# Patient Record
Sex: Male | Born: 1990 | Race: Black or African American | Hispanic: No | Marital: Married | Smoking: Current every day smoker
Health system: Southern US, Community
[De-identification: ages and names within clinical notes are randomized; demographics above are authoritative.]

## PROBLEM LIST (undated history)

## (undated) HISTORY — PX: OTHER SURGICAL HISTORY: SHX169

---
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
## 2013-01-18 ENCOUNTER — Emergency Department (HOSPITAL_COMMUNITY)
Admission: EM | Admit: 2013-01-18 | Discharge: 2013-01-18 | Disposition: A | Discharge status: Home / Self Care | Payer: Self-pay | Attending: Emergency Medicine | Admitting: Emergency Medicine

## 2013-01-18 ENCOUNTER — Encounter (HOSPITAL_COMMUNITY): Payer: Self-pay

## 2013-01-18 DIAGNOSIS — J020 Streptococcal pharyngitis: Secondary | ICD-10-CM | POA: Insufficient documentation

## 2013-01-18 DIAGNOSIS — IMO0001 Reserved for inherently not codable concepts without codable children: Secondary | ICD-10-CM | POA: Insufficient documentation

## 2013-01-18 MED ORDER — SODIUM CHLORIDE 0.9 % IV BOLUS (SEPSIS): 1000.0000 mL | Freq: Once | INTRAVENOUS | Status: DC

## 2013-01-18 MED ORDER — PENICILLIN G BENZATHINE 1200000 UNIT/2ML IM SUSP
1.2000 10*6.[IU] | Freq: Once | INTRAMUSCULAR | Status: AC
  Administered 2013-01-18: 1.2 10*6.[IU] via INTRAMUSCULAR
  Filled 2013-01-18: qty 2

## 2013-01-18 MED ORDER — ACETAMINOPHEN-CODEINE 120-12 MG/5ML PO SOLN: 10.0000 mL | ORAL | Status: DC | PRN

## 2013-01-18 NOTE — ED Notes (Signed)
Pt c/o sore throat and body aches 

## 2013-01-18 NOTE — ED Provider Notes (Signed)
History     CSN: [id number]  Arrival date & time 01/18/13  0146   First MD Initiated Contact with Patient 01/18/13 [phone]      No chief complaint on file.   (Consider location/radiation/quality/duration/timing/severity/associated sxs/prior treatment) HPI The patient presents emergency department with sore throat, and body aches. patient states this began 2 days, ago.  He denies taking anything prior to arrival, for her symptoms.patient denies nausea, vomiting, headache, blurred vision, weakness, diarrhea, abdominal pain, or back pain.  The patient, states that swallowing makes his pain, worse No past medical history on file.  No past surgical history on file.  No family history on file.  History  Substance Use Topics  . Smoking status: Not on file  . Smokeless tobacco: Not on file  . Alcohol Use: Not on file      Review of Systems All other systems negative except as documented in the HPI. All pertinent positives and negatives as reviewed in the HPI. Allergies  Review of patient's allergies indicates no known allergies.  Home Medications  No current outpatient prescriptions on file.  There were no vitals taken for this visit.  Physical Exam  Nursing note and vitals reviewed. Constitutional: He appears well-developed and well-nourished.  HENT:  Head: Normocephalic and atraumatic. No trismus in the jaw.  Mouth/Throat: Uvula is midline and mucous membranes are normal. No edematous. Posterior oropharyngeal edema and posterior oropharyngeal erythema present. No oropharyngeal exudate or tonsillar abscesses.  Eyes: Pupils are equal, round, and reactive to light.  Neck: Normal range of motion. Neck supple.  Cardiovascular: Normal rate and regular rhythm.  Exam reveals no gallop and no friction rub.   No murmur heard. Pulmonary/Chest: Effort normal and breath sounds normal. No respiratory distress.  Skin: Skin is warm and dry. No rash noted.    ED Course  Procedures  (including critical care time)  Labs Reviewed  RAPID STREP SCREEN - Abnormal; Notable for the following:    Streptococcus, Group A Screen (Direct) POSITIVE (*)    All other components within normal limits  patient will be treated with Bicillin IM injection for strep pharyngitis. patient is advised to increase his fluid intake, and rest as much as possible.  He is told to return to the emergency department for any worsening in his condition.    MDM  MDM Reviewed: nursing note and vitals Interpretation: labs            Carlyle Dolly, PA-C 01/18/13 [phone]

## 2013-01-19 NOTE — ED Provider Notes (Signed)
Medical screening examination/treatment/procedure(s) were performed by non-physician practitioner and as supervising physician I was immediately available for consultation/collaboration.  Tarena Gockley, MD 01/19/13 0320 

## 2013-12-04 ENCOUNTER — Encounter (HOSPITAL_COMMUNITY): Payer: Self-pay | Admitting: Emergency Medicine

## 2013-12-04 ENCOUNTER — Emergency Department (HOSPITAL_COMMUNITY)
Admission: EM | Admit: 2013-12-04 | Discharge: 2013-12-04 | Disposition: A | Discharge status: Home / Self Care | Payer: Self-pay | Attending: Emergency Medicine | Admitting: Emergency Medicine

## 2013-12-04 DIAGNOSIS — R1084 Generalized abdominal pain: Secondary | ICD-10-CM | POA: Insufficient documentation

## 2013-12-04 DIAGNOSIS — Z87891 Personal history of nicotine dependence: Secondary | ICD-10-CM | POA: Insufficient documentation

## 2013-12-04 DIAGNOSIS — R110 Nausea: Secondary | ICD-10-CM | POA: Insufficient documentation

## 2013-12-04 DIAGNOSIS — R109 Unspecified abdominal pain: Secondary | ICD-10-CM

## 2013-12-04 LAB — COMPREHENSIVE METABOLIC PANEL
Albumin: 4.2 g/dL (ref 3.5–5.2)
Alkaline Phosphatase: 61 U/L (ref 39–117)
BUN: 14 mg/dL (ref 6–23)
Chloride: 102 mEq/L (ref 96–112)
Creatinine, Ser: 0.81 mg/dL (ref 0.50–1.35)
GFR calc Af Amer: >90 mL/min (ref >90)
GFR calc non Af Amer: >90 mL/min (ref >90)
Glucose, Bld: 108 mg/dL — ABNORMAL HIGH (ref 70–99)
Total Bilirubin: 0.2 mg/dL — ABNORMAL LOW (ref 0.3–1.2)

## 2013-12-04 LAB — CBC
HCT: 38.6 % — ABNORMAL LOW (ref 39.0–52.0)
MCH: 32 pg (ref 26.0–34.0)
MCV: 87.5 fL (ref 78.0–100.0)
Platelets: 190 10*3/uL (ref 150–400)
RBC: 4.41 MIL/uL (ref 4.22–5.81)

## 2013-12-04 MED ORDER — TRAMADOL HCL 50 MG PO TABS
50.0000 mg | ORAL_TABLET | Freq: Once | ORAL | Status: AC
  Administered 2013-12-04: 50 mg via ORAL
  Filled 2013-12-04: qty 1

## 2013-12-04 MED ORDER — TRAMADOL HCL 50 MG PO TABS: 50.0000 mg | ORAL_TABLET | Freq: Four times a day (QID) | ORAL | Status: DC | PRN

## 2013-12-04 MED ORDER — FAMOTIDINE 20 MG PO TABS: 20.0000 mg | ORAL_TABLET | Freq: Two times a day (BID) | ORAL | Status: DC

## 2013-12-04 MED ORDER — ONDANSETRON 4 MG PO TBDP
4.0000 mg | ORAL_TABLET | Freq: Once | ORAL | Status: AC
Start: 2013-12-04 — End: 2013-12-04
  Administered 2013-12-04: 4 mg via ORAL
  Filled 2013-12-04: qty 1

## 2013-12-04 NOTE — ED Notes (Signed)
Belongings went with pt home.

## 2013-12-04 NOTE — ED Provider Notes (Signed)
CSN: [id number]     Arrival date & time 12/04/13  0047 History   First MD Initiated Contact with Patient 12/04/13 0156     Chief Complaint  Patient presents with  . Abdominal Pain   (Consider location/radiation/quality/duration/timing/severity/associated sxs/prior Treatment) HPI Patient is a 22 yo man who presents with about 2 days of intermittent and diffuse abdominal pain. He has had fleeting episodes of nausea but, denies vomiting. He denies fever. LBM was 2 days ago. No GU sx. Denies alcohol and illicit drug use. No history of similar sx. No excacerbating or relieiving factors. Pain is mild to moderate and nonradiating. No history of abdominal surgery  History reviewed. No pertinent past medical history. History reviewed. No pertinent past surgical history. History reviewed. No pertinent family history. History  Substance Use Topics  . Smoking status: Former Games developer  . Smokeless tobacco: Never Used  . Alcohol Use: No    Review of Systems 10 point ROS performed and is negative with the exception of sx noted above.   Allergies  Review of patient's allergies indicates no known allergies.  Home Medications   Current Outpatient Rx  Name  Route  Sig  Dispense  Refill  . acetaminophen-codeine 120-12 MG/5ML solution   Oral   Take 10 mLs by mouth every 4 (four) hours as needed for pain.   120 mL   0    BP 144/86  Pulse 87  Temp(Src) 98.1 F (36.7 C) (Oral)  Resp 18  Ht 5\' 8"  (1.727 m)  Wt 136 lb 4.8 oz (61.825 kg)  BMI 20.73 kg/m2  SpO2 99% Physical Exam Gen: well developed and well nourished appearing Head: NCAT Eyes: PERL, EOMI Nose: no epistaixis or rhinorrhea Mouth/throat: mucosa is moist and pink Neck: supple, no stridor Lungs: CTA B, no wheezing, rhonchi or rales CV: RRR, no murmur, extremities appear well perfused Abd: soft, notender, nondistended Back: no ttp, no cva ttp, normal to inspection Skin: warm and dry Ext: no edema, normal to inspection Neuro:  CN ii-xii grossly intact, no focal deficits Psyche; normal affect,  calm and cooperative.  ED Course  Procedures (including critical care time) Results for orders placed during the hospital encounter of 12/04/13 (from the past 24 hour(s))  CBC     Status: Abnormal   Collection Time    12/04/13  2:49 AM      Result Value Range   WBC 5.6  4.0 - 10.5 K/uL   RBC 4.41  4.22 - 5.81 MIL/uL   Hemoglobin 14.1  13.0 - 17.0 g/dL   HCT 84.6 (*) 96.2 - 95.2 %   MCV 87.5  78.0 - 100.0 fL   MCH 32.0  26.0 - 34.0 pg   MCHC 36.5 (*) 30.0 - 36.0 g/dL   RDW 84.1 (*) 32.4 - 40.1 %   Platelets 190  150 - 400 K/uL  LIPASE, BLOOD     Status: None   Collection Time    12/04/13  2:49 AM      Result Value Range   Lipase 45  11 - 59 U/L  COMPREHENSIVE METABOLIC PANEL     Status: Abnormal   Collection Time    12/04/13  2:49 AM      Result Value Range   Sodium 140  135 - 145 mEq/L   Potassium 3.4 (*) 3.5 - 5.1 mEq/L   Chloride 102  96 - 112 mEq/L   CO2 29  19 - 32 mEq/L   Glucose, Bld 108 (*) 70 -  99 mg/dL   BUN 14  6 - 23 mg/dL   Creatinine, Ser 1.61  0.50 - 1.35 mg/dL   Calcium 9.3  8.4 - 09.6 mg/dL   Total Protein 7.7  6.0 - 8.3 g/dL   Albumin 4.2  3.5 - 5.2 g/dL   AST 21  0 - 37 U/L   ALT 9  0 - 53 U/L   Alkaline Phosphatase 61  39 - 117 U/L   Total Bilirubin 0.2 (*) 0.3 - 1.2 mg/dL   GFR calc non Af Amer >90  >90 mL/min   GFR calc Af Amer >90  >90 mL/min     MDM  ED work up is non-diagnostic. The patient has normal VS, benign abdominal exam and normal CBC, CMP, lipase. He is tolerating po and is stable for d/c with plan for H2 blocker, tramadol prn and outpatient f/u along with return precautions.     Brandt Loosen, MD 12/04/13 [phone]

## 2013-12-04 NOTE — ED Notes (Signed)
Patient presents stating that he has started with abd pain a few days ago.  Denies difficulty urination, denies discharge.  +nausea

## 2013-12-04 NOTE — ED Notes (Signed)
Pt stated that he came to the ED because he has been having abdominal pain x few days. He also has been having nausea. No vomiting or diarrhea.  No issues with urination. No discharge. No cardiac or respiratory distress. Will continue to monitor.

## 2013-12-05 NOTE — Progress Notes (Signed)
ED CM received call from [facility] to verification of discharge prescriptions written by Fransisco Hertz.  No further ED CM needs identified.

## 2014-11-16 ENCOUNTER — Emergency Department (HOSPITAL_COMMUNITY)
Admission: EM | Admit: 2014-11-16 | Discharge: 2014-11-16 | Disposition: A | Discharge status: Home / Self Care | Payer: Self-pay | Attending: Emergency Medicine | Admitting: Emergency Medicine

## 2014-11-16 ENCOUNTER — Encounter (HOSPITAL_COMMUNITY): Payer: Self-pay | Admitting: Physical Medicine and Rehabilitation

## 2014-11-16 DIAGNOSIS — R319 Hematuria, unspecified: Secondary | ICD-10-CM

## 2014-11-16 DIAGNOSIS — R300 Dysuria: Secondary | ICD-10-CM | POA: Insufficient documentation

## 2014-11-16 DIAGNOSIS — Z87891 Personal history of nicotine dependence: Secondary | ICD-10-CM | POA: Insufficient documentation

## 2014-11-16 LAB — URINALYSIS, ROUTINE W REFLEX MICROSCOPIC
BILIRUBIN URINE: NEGATIVE
Glucose, UA: NEGATIVE mg/dL
Ketones, ur: >80 mg/dL — AB
NITRITE: NEGATIVE
Protein, ur: 30 mg/dL — AB
SPECIFIC GRAVITY, URINE: 1.028 (ref 1.005–1.030)
UROBILINOGEN UA: 1 mg/dL (ref 0.0–1.0)
pH: 7 (ref 5.0–8.0)

## 2014-11-16 LAB — COMPREHENSIVE METABOLIC PANEL
ALBUMIN: 5 g/dL (ref 3.5–5.2)
ALT: 14 U/L (ref 0–53)
ANION GAP: 16 — AB (ref 5–15)
AST: 24 U/L (ref 0–37)
Alkaline Phosphatase: 52 U/L (ref 39–117)
BILIRUBIN TOTAL: 0.7 mg/dL (ref 0.3–1.2)
BUN: 12 mg/dL (ref 6–23)
CALCIUM: 10 mg/dL (ref 8.4–10.5)
CHLORIDE: 102 meq/L (ref 96–112)
CO2: 22 mEq/L (ref 19–32)
CREATININE: 0.68 mg/dL (ref 0.50–1.35)
GFR calc Af Amer: >90 mL/min (ref >90)
GFR calc non Af Amer: >90 mL/min (ref >90)
Glucose, Bld: 91 mg/dL (ref 70–99)
Potassium: 3.8 mEq/L (ref 3.7–5.3)
Sodium: 140 mEq/L (ref 137–147)
TOTAL PROTEIN: 8 g/dL (ref 6.0–8.3)

## 2014-11-16 LAB — CBC WITH DIFFERENTIAL/PLATELET
BASOS PCT: 0 % (ref 0–1)
Basophils Absolute: 0 10*3/uL (ref 0.0–0.1)
EOS ABS: 0 10*3/uL (ref 0.0–0.7)
EOS PCT: 0 % (ref 0–5)
HEMATOCRIT: 38.4 % — AB (ref 39.0–52.0)
HEMOGLOBIN: 13.7 g/dL (ref 13.0–17.0)
Lymphocytes Relative: 32 % (ref 12–46)
Lymphs Abs: 1.5 10*3/uL (ref 0.7–4.0)
MCH: 30.9 pg (ref 26.0–34.0)
MCHC: 35.7 g/dL (ref 30.0–36.0)
MCV: 86.5 fL (ref 78.0–100.0)
MONO ABS: 0.2 10*3/uL (ref 0.1–1.0)
MONOS PCT: 5 % (ref 3–12)
Neutro Abs: 3 10*3/uL (ref 1.7–7.7)
Neutrophils Relative %: 63 % (ref 43–77)
Platelets: 181 10*3/uL (ref 150–400)
RBC: 4.44 MIL/uL (ref 4.22–5.81)
RDW: 11.3 % — ABNORMAL LOW (ref 11.5–15.5)
WBC: 4.7 10*3/uL (ref 4.0–10.5)

## 2014-11-16 LAB — URINE MICROSCOPIC-ADD ON

## 2014-11-16 LAB — LIPASE, BLOOD: LIPASE: 27 U/L (ref 11–59)

## 2014-11-16 MED ORDER — LIDOCAINE HCL (PF) 1 % IJ SOLN
2.0000 mL | Freq: Once | INTRAMUSCULAR | Status: AC
  Administered 2014-11-16: 0.9 mL
  Filled 2014-11-16: qty 5

## 2014-11-16 MED ORDER — AZITHROMYCIN 1 G PO PACK
1.0000 g | PACK | Freq: Once | ORAL | Status: AC
  Administered 2014-11-16: 1 g via ORAL
  Filled 2014-11-16: qty 1

## 2014-11-16 MED ORDER — CEFTRIAXONE SODIUM 250 MG IJ SOLR
250.0000 mg | Freq: Once | INTRAMUSCULAR | Status: AC
  Administered 2014-11-16: 250 mg via INTRAMUSCULAR
  Filled 2014-11-16: qty 250

## 2014-11-16 NOTE — ED Notes (Signed)
Patient is resting comfortably. 

## 2014-11-16 NOTE — ED Provider Notes (Signed)
CSN: [id number]     Arrival date & time 11/16/14  1405 History   First MD Initiated Contact with Patient 11/16/14 1605     Chief Complaint  Patient presents with  . Abdominal Pain  . Dysuria     (Consider location/radiation/quality/duration/timing/severity/associated sxs/prior Treatment) HPI Comments: 23 year old male without any significant past medical history that presents to the emergency department today with 2 months worth of intermittent lower abdominal pain. States that this is present in the morning and he describes it as growling dull and sometimes sharp in nature. This could all pain is always relieved by other bowel movements or eating something. End and does not have the pain at all throughout the day. This happens 2-3 times a week for the last 2 months. Also complains of intermittent urethral irritation when urinating. This is not consistent and does not notice any change in his urine color or odor that is consistent either.   History reviewed. No pertinent past medical history. History reviewed. No pertinent past surgical history. History reviewed. No pertinent family history. History  Substance Use Topics  . Smoking status: Former Games developermoker  . Smokeless tobacco: Never Used  . Alcohol Use: No    Review of Systems  Constitutional: Negative for fever and chills.  Respiratory: Negative for cough and shortness of breath.   Gastrointestinal: Positive for abdominal pain. Negative for nausea, vomiting and diarrhea.  Endocrine: Negative for polydipsia and polyuria.  Genitourinary: Positive for dysuria.  Musculoskeletal: Negative for back pain and neck pain.  Skin: Negative for pallor and wound.      Allergies  Review of patient's allergies indicates no known allergies.  Home Medications   Prior to Admission medications   Medication Sig Start Date End Date Taking? Authorizing Provider  famotidine (PEPCID) 20 MG tablet Take 1 tablet (20 mg total) by mouth 2 (two) times  daily. Patient not taking: Reported on 11/16/2014 12/04/13   Brandt LoosenJulie Manly, MD  famotidine (PEPCID) 20 MG tablet Take 1 tablet (20 mg total) by mouth 2 (two) times daily. Patient not taking: Reported on 11/16/2014 12/04/13   Brandt LoosenJulie Manly, MD  traMADol (ULTRAM) 50 MG tablet Take 1 tablet (50 mg total) by mouth every 6 (six) hours as needed. Patient not taking: Reported on 11/16/2014 12/04/13   Brandt LoosenJulie Manly, MD  traMADol (ULTRAM) 50 MG tablet Take 1 tablet (50 mg total) by mouth every 6 (six) hours as needed. Patient not taking: Reported on 11/16/2014 12/04/13   Brandt LoosenJulie Manly, MD   BP 115/82 mmHg  Pulse 93  Temp(Src) 98.6 F (37 C) (Oral)  Resp 19  Ht 5\' 5"  (1.651 m)  Wt 130 lb (58.968 kg)  BMI 21.63 kg/m2  SpO2 100% Physical Exam  Constitutional: He is oriented to person, place, and time. He appears well-developed and well-nourished.  HENT:  Head: Normocephalic and atraumatic.  Eyes: Conjunctivae and EOM are normal.  Neck: Normal range of motion. Neck supple.  Cardiovascular: Normal rate and regular rhythm.   Pulmonary/Chest: Effort normal. No respiratory distress.  Abdominal: Soft. There is no tenderness.  Musculoskeletal: Normal range of motion. He exhibits no edema or tenderness.  Neurological: He is alert and oriented to person, place, and time.  Skin: Skin is warm and dry.  Nursing note and vitals reviewed.   ED Course  Procedures (including critical care time) Labs Review Labs Reviewed  CBC WITH DIFFERENTIAL - Abnormal; Notable for the following:    HCT 38.4 (*)    RDW 11.3 (*)  All other components within normal limits  COMPREHENSIVE METABOLIC PANEL - Abnormal; Notable for the following:    Anion gap 16 (*)    All other components within normal limits  URINALYSIS, ROUTINE W REFLEX MICROSCOPIC - Abnormal; Notable for the following:    APPearance HAZY (*)    Hgb urine dipstick LARGE (*)    Ketones, ur >80 (*)    Protein, ur 30 (*)    Leukocytes, UA TRACE (*)    All other  components within normal limits  URINE MICROSCOPIC-ADD ON - Abnormal; Notable for the following:    Squamous Epithelial / LPF FEW (*)    Bacteria, UA FEW (*)    All other components within normal limits  URINE CULTURE  GC/CHLAMYDIA PROBE AMP  LIPASE, BLOOD    Imaging Review No results found.   EKG Interpretation None      MDM   Final diagnoses:  Hematuria    23 year old male with intermittent bowel pain and dysuria for the last couple months. He is monogamous and uses protection. Exam benign. Hematuria on urine, leukocytes in urine. Bmp normal. Possibly nephrotic syndrome, nephritic syndrome, STD or testicular pathology. Denies trauma to urethra. Will tx ppx for std, send culture of urine adn have him follow up with urology.     Marily MemosJason Otisha Spickler, MD 11/17/14 [phone]

## 2014-11-16 NOTE — ED Provider Notes (Signed)
23 year old male comes in with several month history of intermittent pain in the lower abdomen. He states are sometimes some tingling when he urinates but not in a consistent basis. No fever or chills. No nausea or vomiting. No constipation or diarrhea. He denies unprotected sex. On exam, abdomen is soft and nontender. There is no CVA tenderness. Urinalysis is come back significant for ketonuria and microscopic hematuria. Urine is sent for culture and he is referred to urology for follow-up.  I saw and evaluated the patient, reviewed the resident's note and I agree with the findings and plan.    Dione Boozeavid Savonna Birchmeier, MD 11/16/14 [phone]

## 2014-11-16 NOTE — ED Notes (Signed)
Patient is alert and orientedx4.  Patient was explained discharge instructions and they understood them with no questions.   

## 2014-11-16 NOTE — ED Notes (Signed)
Pt states diffuse abdominal pain and dysuria. Ongoing x2 weeks. 5/10 pain upon arrival to ED. No nausea/vomiting.

## 2014-11-17 LAB — GC/CHLAMYDIA PROBE AMP
CT Probe RNA: POSITIVE — AB
GC PROBE AMP APTIMA: NEGATIVE

## 2014-11-17 LAB — URINE CULTURE
COLONY COUNT: NO GROWTH
Culture: NO GROWTH

## 2014-11-18 ENCOUNTER — Telehealth (HOSPITAL_COMMUNITY): Payer: Self-pay

## 2014-11-18 NOTE — Telephone Encounter (Signed)
Results received from [facility].  (+) Chlamydia Treated with Zithromax and Rocephin.  Pt ID verified x 2.  Pt informed of dx, tx rcvd appropriate, notify partner & abstain from sex x 2 weeks.  [facility] form completed and faxed.   Pt reported SS# [id number], address and DOB verified

## 2015-04-21 ENCOUNTER — Emergency Department (HOSPITAL_COMMUNITY)
Admission: EM | Admit: 2015-04-21 | Discharge: 2015-04-21 | Disposition: A | Discharge status: Home / Self Care | Payer: Self-pay | Attending: Emergency Medicine | Admitting: Emergency Medicine

## 2015-04-21 ENCOUNTER — Encounter (HOSPITAL_COMMUNITY): Payer: Self-pay | Admitting: Emergency Medicine

## 2015-04-21 DIAGNOSIS — K029 Dental caries, unspecified: Secondary | ICD-10-CM | POA: Insufficient documentation

## 2015-04-21 DIAGNOSIS — Z87891 Personal history of nicotine dependence: Secondary | ICD-10-CM | POA: Insufficient documentation

## 2015-04-21 DIAGNOSIS — K088 Other specified disorders of teeth and supporting structures: Secondary | ICD-10-CM | POA: Insufficient documentation

## 2015-04-21 DIAGNOSIS — K0889 Other specified disorders of teeth and supporting structures: Secondary | ICD-10-CM

## 2015-04-21 MED ORDER — HYDROCODONE-ACETAMINOPHEN 5-325 MG PO TABS
2.0000 | ORAL_TABLET | Freq: Once | ORAL | Status: AC
  Administered 2015-04-21: 2 via ORAL
  Filled 2015-04-21: qty 2

## 2015-04-21 MED ORDER — PENICILLIN V POTASSIUM 250 MG PO TABS
500.0000 mg | ORAL_TABLET | Freq: Once | ORAL | Status: AC
  Administered 2015-04-21: 500 mg via ORAL
  Filled 2015-04-21: qty 2

## 2015-04-21 MED ORDER — PENICILLIN V POTASSIUM 500 MG PO TABS: 500.0000 mg | ORAL_TABLET | Freq: Four times a day (QID) | ORAL | Status: AC

## 2015-04-21 MED ORDER — TRAMADOL HCL 50 MG PO TABS: 50.0000 mg | ORAL_TABLET | Freq: Four times a day (QID) | ORAL | Status: DC | PRN

## 2015-04-21 NOTE — ED Notes (Signed)
Pt. reports right low molar pain for 2 week .

## 2015-04-21 NOTE — ED Provider Notes (Signed)
CSN: [id number]     Arrival date & time 04/21/15  0038 History   First MD Initiated Contact with Patient 04/21/15 [phone]     Chief Complaint  Patient presents with  . Dental Pain    (Consider location/radiation/quality/duration/timing/severity/associated sxs/prior Treatment) Patient is a 24 y.o. male presenting with tooth pain. The history is provided by the patient. No language interpreter was used.  Dental Pain Location:  Lower Lower teeth location:  29/RL 2nd bicuspid Quality:  Sharp, throbbing and aching Severity:  Mild Onset quality:  Gradual Duration:  2 weeks Timing:  Constant Progression:  Worsening Chronicity:  New Context: dental fracture   Context: not recent dental surgery and not trauma   Relieved by:  Nothing Exacerbated by: eating. Ineffective treatments: Orajel and Ibuprofen. Associated symptoms: facial pain   Associated symptoms: no difficulty swallowing, no drooling, no facial swelling, no fever, no oral bleeding, no oral lesions and no trismus     History reviewed. No pertinent past medical history. History reviewed. No pertinent past surgical history. No family history on file. History  Substance Use Topics  . Smoking status: Former Games developermoker  . Smokeless tobacco: Never Used  . Alcohol Use: No    Review of Systems  Constitutional: Negative for fever.  HENT: Positive for dental problem. Negative for drooling, facial swelling and mouth sores.   All other systems reviewed and are negative.   Allergies  Review of patient's allergies indicates no known allergies.  Home Medications   Prior to Admission medications   Medication Sig Start Date End Date Taking? Authorizing Provider  famotidine (PEPCID) 20 MG tablet Take 1 tablet (20 mg total) by mouth 2 (two) times daily. Patient not taking: Reported on 11/16/2014 12/04/13   Brandt LoosenJulie Manly, MD  famotidine (PEPCID) 20 MG tablet Take 1 tablet (20 mg total) by mouth 2 (two) times daily. Patient not taking:  Reported on 11/16/2014 12/04/13   Brandt LoosenJulie Manly, MD  penicillin v potassium (VEETID) 500 MG tablet Take 1 tablet (500 mg total) by mouth 4 (four) times daily. 04/21/15 04/28/15  Antony MaduraKelly Zeya Balles, PA-C  traMADol (ULTRAM) 50 MG tablet Take 1 tablet (50 mg total) by mouth every 6 (six) hours as needed. 04/21/15   Antony MaduraKelly Kyden Potash, PA-C   BP 126/84 mmHg  Pulse 62  Temp(Src) 98 F (36.7 C) (Oral)  Resp 16  Ht 5\' 8"  (1.727 m)  Wt 130 lb (58.968 kg)  BMI 19.77 kg/m2  SpO2 100%   Physical Exam  Constitutional: He is oriented to person, place, and time. He appears well-developed and well-nourished. No distress.  HENT:  Head: Normocephalic and atraumatic.  Mouth/Throat: Uvula is midline, oropharynx is clear and moist and mucous membranes are normal. No trismus in the jaw. Abnormal dentition. Dental caries present. No dental abscesses or uvula swelling.    No trismus. Patient tolerating secretions without difficulty or drooling.  Eyes: Conjunctivae and EOM are normal. No scleral icterus.  Neck: Normal range of motion.  Pulmonary/Chest: Effort normal. No respiratory distress.  Musculoskeletal: Normal range of motion.  Neurological: He is alert and oriented to person, place, and time.  Skin: Skin is warm and dry. No rash noted. He is not diaphoretic. No erythema. No pallor.  Psychiatric: He has a normal mood and affect. His behavior is normal.  Nursing note and vitals reviewed.   ED Course  Procedures (including critical care time) Labs Review Labs Reviewed - No data to display  Imaging Review No results found.   EKG Interpretation None  MDM   Final diagnoses:  Dentalgia    Patient with toothache. No gross abscess. Exam unconcerning for Ludwig's angina or spread of infection. Will treat with penicillin and pain medicine. Urged patient to follow-up with dentist. Referral and resource guide provided. Patient agreeable to plan with no unaddressed concerns. Patient discharged in good  condition.   Filed Vitals:   04/21/15 0428  BP: 126/84  Pulse: 62  Temp: 98 F (36.7 C)  Resp: [address]      Maeleigh Buschman, PA-C 04/21/15 [id number]  Marisa Severinlga Otter, MD 04/21/15 0600

## 2015-04-21 NOTE — Discharge Instructions (Signed)
Dental Pain °A tooth ache may be caused by cavities (tooth decay). Cavities expose the nerve of the tooth to air and hot or cold temperatures. It may come from an infection or abscess (also called a boil or furuncle) around your tooth. It is also often caused by dental caries (tooth decay). This causes the pain you are having. °DIAGNOSIS  °Your caregiver can diagnose this problem by exam. °TREATMENT  °· If caused by an infection, it may be treated with medications which kill germs (antibiotics) and pain medications as prescribed by your caregiver. Take medications as directed. °· Only take over-the-counter or prescription medicines for pain, discomfort, or fever as directed by your caregiver. °· Whether the tooth ache today is caused by infection or dental disease, you should see your dentist as soon as possible for further care. °SEEK MEDICAL CARE IF: °The exam and treatment you received today has been provided on an emergency basis only. This is not a substitute for complete medical or dental care. If your problem worsens or new problems (symptoms) appear, and you are unable to meet with your dentist, call or return to this location. °SEEK IMMEDIATE MEDICAL CARE IF:  °· You have a fever. °· You develop redness and swelling of your face, jaw, or neck. °· You are unable to open your mouth. °· You have severe pain uncontrolled by pain medicine. °MAKE SURE YOU:  °· Understand these instructions. °· Will watch your condition. °· Will get help right away if you are not doing well or get worse. °Document Released: 11/27/2005 Document Revised: 02/19/2012 Document Reviewed: 07/15/2008 °ExitCare® Patient Information ©2015 [employer]. This information is not intended to replace advice given to you by your health care provider. Make sure you discuss any questions you have with your health care provider. ° °Emergency Department Resource Guide °1) Find a Doctor and Pay Out of Pocket °Although you won't have to find out who  is covered by your insurance plan, it is a good idea to ask around and get recommendations. You will then need to call the office and see if the doctor you have chosen will accept you as a new patient and what types of options they offer for patients who are self-pay. Some doctors offer discounts or will set up payment plans for their patients who do not have insurance, but you will need to ask so you aren't surprised when you get to your appointment. ° °2) Contact Your Local Health Department °Not all health departments have doctors that can see patients for sick visits, but many do, so it is worth a call to see if yours does. If you don't know where your local health department is, you can check in your phone book. The CDC also has a tool to help you locate your state's health department, and many state websites also have listings of all of their local health departments. ° °3) Find a Walk-in Clinic °If your illness is not likely to be very severe or complicated, you may want to try a walk in clinic. These are popping up all over the country in pharmacies, drugstores, and shopping centers. They're usually staffed by nurse practitioners or physician assistants that have been trained to treat common illnesses and complaints. They're usually fairly quick and inexpensive. However, if you have serious medical issues or chronic medical problems, these are probably not your best option. ° °No Primary Care Doctor: °- Call [facility] at  832-8000 - they can help you locate a primary   care doctor that  accepts your insurance, provides certain services, etc. °- Physician Referral Service- 1-800-533-3463 ° °Chronic Pain Problems: °Organization         Address  Phone   Notes  °[facility]  (336) 297-2271 Patients need to be referred by their primary care doctor.  ° °Medication Assistance: °Organization         Address  Phone   Notes  °[place] [facility] [address]  [address] °[place], [place] [address] (336) 641-8030 --Must be a resident of [place] °-- Must have NO insurance coverage whatsoever (no Medicaid/ Medicare, etc.) °-- The pt. MUST have a primary care doctor that directs their care regularly and follows them in the community °  °[facility]  (866) 331-1348   °[employer]  (888) 892-1162   ° °Agencies that provide inexpensive medical care: °Organization         Address  Phone   Notes  °[facility]  (336) 832-8035   °[facility]    (336) 832-7272   °[facility] [address] °[place], [place] [address] (336) 832-4777   °[facility] [address], °[place] (336) 271-4999   °[facility]    (336) 373-0678   °[facility]    (336) 272-1050   °[facility] ° [address], [place]  Phone:  (336) 832-4444, Fax:  (336) 832-4440 Hours of Operation:  9 am - 6 pm, M-F.  Also accepts Medicaid/Medicare and self-pay.  °[facility] ° [address], [place] Phone: (336) 832-3150, Fax: (336) 832-3151. Hours of Operation:  8:30 am - 5:30 pm, M-F.  Also accepts Medicaid and self-pay.  °[facility] [place] [address], [place] Phone: (336) 878-6027   °[facility] [address], [place], [place] (336)723-1848, Ext. 123 Mondays & Thursdays: 7-9 AM.  First 15 patients are seen on a first come, first serve basis. °  ° °Medicaid-accepting [place] Providers: ° °Organization         Address  Phone   Notes  °[facility] [address], [place] (336) 641-2100 Also accepts self-pay patients.  °[facility] [address], [place] ° (336) 856-9996   °[facility] [address], [place] (336) 288-8857   °[facility] [address] [place] [address], [place] (336) 299-7000   °Veita Bland [address], [place]  ° (336) 373-1557 Only accepts  Access Medicaid patients after they have their name applied to their card.  ° °Self-Pay (no insurance) in [place]: ° °Organization         Address  Phone   Notes  °Sickle Cell Patients, [facility] [address], [place] (336) 832-1970   °[facility] [address], [place] (336) 832-4400   °[facility] ° [address], [place] (336) 992-4800   °[facility]/Dr. Osei-Bonsu ° [address] [place] [address], [place] or [address], [place] (336) 841-8500 Phone number for both [place] and [place] locations is the same.  °[facility] [address], [place] (336) 299-0000   °[facility] [address] [place] [address],  or [address] (336) 852-7530 °(336) 878-2260   °  [facility] [address], [place] (336) 350-1642, phone; (336) 294-5005, fax Sees patients 1st and 3rd Saturday of every month.  Must not qualify for public or private insurance (i.e. Medicaid, Medicare, [place] Health Choice, Veterans' Benefits) • Household income should be no more than 200% of the poverty level •The clinic cannot treat you if you are pregnant or think you are pregnant • Sexually transmitted diseases are not treated at the clinic.  ° ° °Dental Care: °Organization         Address  Phone  Notes  °[facility] [address], [place] (336) 641-6152 Accepts children up to age 21 who are enrolled in Medicaid or [place] Health Choice; pregnant women with a Medicaid card; and children who have applied for Medicaid or [place] Health Choice, but were declined, whose parents can pay a reduced fee at time of service.  °[facility] [place]  [address], [place] (336) 641-7733 Accepts children up to age 21 who are  enrolled in Medicaid or [place] Health Choice; pregnant women with a Medicaid card; and children who have applied for Medicaid or [place] Health Choice, but were declined, whose parents can pay a reduced fee at time of service.  °[facility] ° [address], [place] (336) 641-4533 Patients are seen by appointment only. Walk-ins are not accepted. [facility] will see patients 18 years of age and older. °Monday - Tuesday (8am-5pm) °Most Wednesdays (8:30-5pm) °$30 per visit, cash only  °[facility] ° [address], [place] (336) 641-4533 Patients are seen by appointment only. Walk-ins are not accepted. [facility] will see patients 18 years of age and older. °One Wednesday Evening (Monthly: Volunteer Based).  $30 per visit, cash only  °[facility]  (919) 537-3737 for adults; Children under age 4, call Graduate Pediatric Dentistry at (919) [phone]. Children aged 4-14, please call (919) [phone] to request a pediatric application. ° Dental services are provided in all areas of dental care including fillings, crowns and bridges, complete and partial dentures, implants, gum treatment, root canals, and extractions. Preventive care is also provided. Treatment is provided to both adults and children. °Patients are selected via a lottery and there is often a waiting list. °  °[facility] [address], °[place] ° (336) 763-8833 [email or web address] °  °[facility] [address], [place], [place] (336)723-1848, Ext. 123 Second and Fourth Thursday of each month, opens at 6:30 AM; Clinic ends at 9 AM.  Patients are seen on a first-come first-served basis, and a limited number are seen during each clinic.  ° °[facility] ° [address], [place], [place] (336) 723-7904   Eligibility Requirements °You must have lived in [place], [place], or [place] counties for at least the last three months. °  You  cannot be eligible for state or federal sponsored healthcare insurance, including Veterans Administration, Medicaid, or Medicare. °  You generally cannot be eligible for healthcare insurance through your employer.  °  How to apply: °Eligibility screenings are held every Tuesday and Wednesday afternoon from 1:00 pm until 4:00 pm. You do not need an appointment for the interview!  °[facility] [address], [place],  336-631-2330   °[facility]  336-342-8273   °[facility]  336-703-3100   °[facility]  336-570-6415   ° °Behavioral Health Resources in the Community: °Intensive Outpatient Programs °Organization         Address  Phone  Notes  °[facility] [address], [place], [place] 336-878-6098   °[facility] [address], [place], [place] 336-832-9800   °[facility] [address], [place], [place] ° 336-882-2125   °[facility] [address],  °[place], [place] 1-800-853-5163 or 336-641-4981   °Substance Abuse Resources °Organization         Address  Phone  Notes  °[facility]  336-882-2125   °[facility]  336-784-9470   °[facility]  336-285-9073   °[facility]  336-845-3988   °Residential & Outpatient Substance Abuse Program  1-800-659-3381   °Psychological Services °Organization         Address  Phone  Notes  °[facility]  336- 832-9600   °[facility]  336- 378-7881   °[facility] [address], [place] 1-800-853-5163 or 336-641-4981   ° °Mobile Crisis Teams °Organization         Address  Phone  Notes  °[facility], Mobile Crisis Care Unit  1-877-626-1772   °Assertive °[facility] ° [address] [place], [place] 336-834-9664   °Sharon DeEsch [address] °[place] 336-554-5454   ° °Self-Help/Support  Groups °Organization         Address  Phone             Notes  °[facility] - variety of support groups  336- 373-1402 Call for more information  °Narcotics Anonymous (NA), [facility] [address], °[place]  2 meetings at this location  ° °Residential Treatment Programs °Organization         Address  Phone  Notes  °[facility] [address],    °[place]  1-866-801-8205   °[facility] ° [address], [place], [place] 704-293-8524   °[facility] [address], [place] 336-845-3988 Admissions: 8am-3pm M-F  °[facility] [address]    °[place], [place] 336-841-1104   °[facility] [address], [place], [place] 336-379-7146   °[facility] [address]  °[place], [place] 336-285-9073   °[facility] - Intensive Outpatient [address], [place], [place] 336-852-3033   °[facility] [address]  °[place], [place] 1-877-615-2722 or 336-784-9470   °[facility] [address] [place], [place] 336-227-7417 Accepts Medicaid  °[facility] [address]  °[place] 1-800-659-3381 Substance Abuse/Addiction Treatment  ° °[place] Behavioral Health Resources °Organization         Address  Phone  Notes  °[facility]  (888) 581-9988   °Julie Brannon, PhD [address] [place], [place]   (336) 349-5553 or (336) 951-0000   °[facility]   [address] °[place], [place] (336) 349-4454   °[facility] [address], [place], [place] (336) 342-8316 Insurance/Medicaid/sponsorship through [facility]  °[facility] [address]                                    [place],  (336) 342-8316 Therapy/tele-psych/case  °[facility]   [address]  ° [place], [place] (336) 349-2233    °Dr. Arfeen  (336) 349-4544   °[facility] [place]  [employer] [facility] [place] [facility] 1) [address], [place] °2) [address], [place] °3)  [address], [place] (336) 349-3220 °(336) 342-7768 ° °(336) 342-8140   °[place] [facility] (336) 342-1394 or (336) 342-3537 (After Hours)    ° ° ° °

## 2017-02-12 ENCOUNTER — Emergency Department (HOSPITAL_COMMUNITY)
Admission: EM | Admit: 2017-02-12 | Discharge: 2017-02-12 | Disposition: A | Discharge status: Home / Self Care | Payer: Self-pay | Attending: Emergency Medicine | Admitting: Emergency Medicine

## 2017-02-12 ENCOUNTER — Encounter (HOSPITAL_COMMUNITY): Payer: Self-pay

## 2017-02-12 DIAGNOSIS — Z79899 Other long term (current) drug therapy: Secondary | ICD-10-CM | POA: Insufficient documentation

## 2017-02-12 DIAGNOSIS — Z87891 Personal history of nicotine dependence: Secondary | ICD-10-CM | POA: Insufficient documentation

## 2017-02-12 DIAGNOSIS — L259 Unspecified contact dermatitis, unspecified cause: Secondary | ICD-10-CM | POA: Insufficient documentation

## 2017-02-12 MED ORDER — PREDNISONE 20 MG PO TABS: ORAL_TABLET | ORAL | 0 refills | Status: DC

## 2017-02-12 MED ORDER — PREDNISONE 20 MG PO TABS
60.0000 mg | ORAL_TABLET | Freq: Once | ORAL | Status: AC
  Administered 2017-02-12: 60 mg via ORAL
  Filled 2017-02-12: qty 3

## 2017-02-12 MED ORDER — HYDROXYZINE HCL 25 MG PO TABS: 25.0000 mg | ORAL_TABLET | Freq: Four times a day (QID) | ORAL | 0 refills | Status: DC

## 2017-02-12 MED ORDER — TRIAMCINOLONE ACETONIDE 0.1 % EX CREA: 1.0000 "application " | TOPICAL_CREAM | Freq: Two times a day (BID) | CUTANEOUS | 0 refills | Status: DC

## 2017-02-12 MED ORDER — HYDROXYZINE HCL 25 MG PO TABS
25.0000 mg | ORAL_TABLET | Freq: Once | ORAL | Status: AC
  Administered 2017-02-12: 25 mg via ORAL
  Filled 2017-02-12: qty 1

## 2017-02-12 NOTE — ED Provider Notes (Signed)
[facility]EM[facility] DEPT Provider Note   CSN: [id number] Arrival date & time: 02/12/17  0100     History   Chief Complaint Chief Complaint  Patient presents with  . Rash    HPI Jeffrey Heath is a 26 y.o. male.  The history is provided by the patient.  Rash   This is a new problem. The current episode started 12 to 24 hours ago. The problem has been gradually worsening. The problem is associated with a new detergent/soap. There has been no fever. The rash is present on the abdomen, trunk, right arm, left ankle, right upper leg, right lower leg, left arm, left buttock, left upper leg, left lower leg and right buttock. The pain is at a severity of 0/10. Associated symptoms include itching and weeping. Pertinent negatives include no blisters. He has tried antihistamines for the symptoms. The treatment provided no relief.    Patient with Rash that began yesterday after 2 days of using a new body wash. He has associated erythema, multiple wheals and severe itching. The patient denies any contacts with associated symptoms. He has taken Benadryl without relief. He denies fevers, recent communal living situations.  History reviewed. No pertinent past medical history.  There are no active problems to display for this patient.   History reviewed. No pertinent surgical history.     Home Medications    Prior to Admission medications   Medication Sig Start Date End Date Taking? Authorizing Provider  famotidine (PEPCID) 20 MG tablet Take 1 tablet (20 mg total) by mouth 2 (two) times daily. Patient not taking: Reported on 11/16/2014 12/04/13   Brandt LoosenJulie Manly, MD  famotidine (PEPCID) 20 MG tablet Take 1 tablet (20 mg total) by mouth 2 (two) times daily. Patient not taking: Reported on 11/16/2014 12/04/13   Brandt LoosenJulie Manly, MD  traMADol (ULTRAM) 50 MG tablet Take 1 tablet (50 mg total) by mouth every 6 (six) hours as needed. 04/21/15   Antony MaduraKelly Humes, PA-C    Family History History reviewed. No pertinent  family history.  Social History Social History  Substance Use Topics  . Smoking status: Former Games developermoker  . Smokeless tobacco: Never Used  . Alcohol use No     Allergies   Patient has no known allergies.   Review of Systems Review of Systems  Constitutional: Negative for fever.  Skin: Positive for itching and rash.     Physical Exam Updated Vital Signs BP 99/61   Pulse 73   Temp 98.6 F (37 C) (Oral)   Resp 16   SpO2 99%   Physical Exam  Constitutional: He appears well-developed and well-nourished. No distress.  HENT:  Head: Normocephalic and atraumatic.  Eyes: Conjunctivae are normal. No scleral icterus.  Neck: Normal range of motion. Neck supple.  Cardiovascular: Normal rate, regular rhythm and normal heart sounds.   Pulmonary/Chest: Effort normal and breath sounds normal. No respiratory distress.  Abdominal: Soft. There is no tenderness.  Musculoskeletal: He exhibits no edema.  Neurological: He is alert.  Skin: Skin is warm and dry. He is not diaphoretic.  Multiple areas of Singulair, and confluent maculopapular erythematous wheals. On the medial forearm. There is some vesicular eruptions similar to poison ivy dermatitis. No signs of secondary infection. No palmar or plantar involvement.  Psychiatric: His behavior is normal.  Nursing note and vitals reviewed.    ED Treatments / Results  Labs (all labs ordered are listed, but only abnormal results are displayed) Labs Reviewed - No data to display  EKG  EKG Interpretation None       Radiology No results found.  Procedures Procedures (including critical care time)  Medications Ordered in ED Medications - No data to display   Initial Impression / Assessment and Plan / ED Course  I have reviewed the triage vital signs and the nursing notes.  Pertinent labs & imaging results that were available during my care of the patient were reviewed by me and considered in my medical decision making (see chart  for details).       Patient with contact dermatitis. Instructed to avoid offending agent and to use unscented soaps, lotions, and detergents.  No signs of secondary infection. Follow up with pcp in 2-3 days. Return precautions discussed. Pt is safe for discharge at this time.        Final Clinical Impressions(s) / ED Diagnoses   Final diagnoses:  Contact dermatitis, unspecified contact dermatitis type, unspecified trigger    New Prescriptions New Prescriptions   No medications on file     Arthor Captain, PA-C 02/12/17 [id number]    Tomasita Crumble, MD 02/12/17 [phone]

## 2017-02-12 NOTE — ED Triage Notes (Signed)
Pt complaining of rash to bilateral arms. Pt states new deodorants. Pt with small bumps to bilateral arms, no redness no swelling. Pt ambulatory, VSS. NAD.

## 2017-02-12 NOTE — Discharge Instructions (Signed)
Get help right away if: °· You have a severe headache, neck pain, or neck stiffness. °· You vomit. °· You feel very sleepy. °· You notice red streaks coming from the affected area. °· Your bone or joint underneath the affected area becomes painful after the skin has healed. °· The affected area turns darker. °· You have difficulty breathing. ° °

## 2017-03-17 ENCOUNTER — Emergency Department (HOSPITAL_COMMUNITY)
Admission: EM | Admit: 2017-03-17 | Discharge: 2017-03-18 | Disposition: A | Discharge status: Home / Self Care | Payer: Self-pay | Attending: Emergency Medicine | Admitting: Emergency Medicine

## 2017-03-17 ENCOUNTER — Encounter (HOSPITAL_COMMUNITY): Payer: Self-pay

## 2017-03-17 DIAGNOSIS — R300 Dysuria: Secondary | ICD-10-CM | POA: Insufficient documentation

## 2017-03-17 DIAGNOSIS — R3129 Other microscopic hematuria: Secondary | ICD-10-CM | POA: Insufficient documentation

## 2017-03-17 DIAGNOSIS — Z87891 Personal history of nicotine dependence: Secondary | ICD-10-CM | POA: Insufficient documentation

## 2017-03-17 NOTE — ED Triage Notes (Signed)
Pt complaining of burning itching with urination. Pt denies any penile discharge or bleeding. Pt some intermittent abdominal pain. Pt denies any N/V/D.

## 2017-03-18 LAB — URINALYSIS, ROUTINE W REFLEX MICROSCOPIC
BILIRUBIN URINE: NEGATIVE
Glucose, UA: NEGATIVE mg/dL
Ketones, ur: NEGATIVE mg/dL
LEUKOCYTES UA: NEGATIVE
NITRITE: NEGATIVE
PH: 7 (ref 5.0–8.0)
Protein, ur: NEGATIVE mg/dL
SPECIFIC GRAVITY, URINE: 1.019 (ref 1.005–1.030)
Squamous Epithelial / LPF: NONE SEEN

## 2017-03-18 LAB — HIV ANTIBODY (ROUTINE TESTING W REFLEX): HIV Screen 4th Generation wRfx: NONREACTIVE

## 2017-03-18 LAB — RPR: RPR: NONREACTIVE

## 2017-03-18 MED ORDER — STERILE WATER FOR INJECTION IJ SOLN
INTRAMUSCULAR | Status: AC
  Administered 2017-03-18: 1 mL
  Filled 2017-03-18: qty 10

## 2017-03-18 MED ORDER — AZITHROMYCIN 250 MG PO TABS
1000.0000 mg | ORAL_TABLET | Freq: Once | ORAL | Status: AC
Start: 2017-03-18 — End: 2017-03-18
  Administered 2017-03-18: 1000 mg via ORAL
  Filled 2017-03-18: qty 4

## 2017-03-18 MED ORDER — CEFTRIAXONE SODIUM 250 MG IJ SOLR
250.0000 mg | Freq: Once | INTRAMUSCULAR | Status: AC
  Administered 2017-03-18: 250 mg via INTRAMUSCULAR
  Filled 2017-03-18: qty 250

## 2017-03-18 NOTE — ED Provider Notes (Addendum)
[facility]-EMERGENCY DEPT Provider Note   CSN: [id number] Arrival date & time: 03/17/17  2332  By signing my name below, I, Cynda Acres, attest that this documentation has been prepared under the direction and in the presence of Melene Plan, DO. Electronically Signed: Cynda Acres, Scribe. 03/18/17. 12:12 AM.   History   Chief Complaint Chief Complaint  Patient presents with  . Dysuria    HPI Comments: Jeffrey Heath is a 26 y.o. male with no pertinent medical history, who presents to the Emergency Department complaining of sudden-onset, intermittent dysuria that began a few days ago. Patient reports associated penile itching and intermittent lower abdominal pain. No modifying factors indicated. Urinating makes his pain worse, nothing improves his pain. Patient denies any nausea, vomiting, fever, hematuria, penile discharge, penile lesions, or any other symptoms.    The history is provided by the patient. No language interpreter was used.  Dysuria   This is a new problem. The current episode started 2 days ago. The problem occurs every urination. The problem has not changed since onset.The quality of the pain is described as burning. There has been no fever. Pertinent negatives include no chills, no vomiting, no discharge and no hematuria. He has tried nothing for the symptoms.    History reviewed. No pertinent past medical history.  There are no active problems to display for this patient.   History reviewed. No pertinent surgical history.     Home Medications    Prior to Admission medications   Medication Sig Start Date End Date Taking? Authorizing Provider  famotidine (PEPCID) 20 MG tablet Take 1 tablet (20 mg total) by mouth 2 (two) times daily. Patient not taking: Reported on 11/16/2014 12/04/13   Brandt Loosen, MD  famotidine (PEPCID) 20 MG tablet Take 1 tablet (20 mg total) by mouth 2 (two) times daily. Patient not taking: Reported on 11/16/2014 12/04/13   Brandt Loosen, MD    hydrOXYzine (ATARAX/VISTARIL) 25 MG tablet Take 1 tablet (25 mg total) by mouth every 6 (six) hours. Patient not taking: Reported on 03/18/2017 02/12/17   Arthor Captain, PA-C  predniSONE (DELTASONE) 20 MG tablet 3 tabs po daily x 3 days, then 2 tabs x 3 days, then 1.5 tabs x 3 days, then 1 tab x 3 days, then 0.5 tabs x 3 days Patient not taking: Reported on 03/18/2017 02/12/17   Arthor Captain, PA-C  traMADol (ULTRAM) 50 MG tablet Take 1 tablet (50 mg total) by mouth every 6 (six) hours as needed. Patient not taking: Reported on 03/18/2017 04/21/15   Antony Madura, PA-C  triamcinolone cream (KENALOG) 0.1 % Apply 1 application topically 2 (two) times daily. Patient not taking: Reported on 03/18/2017 02/12/17   Arthor Captain, PA-C    Family History History reviewed. No pertinent family history.  Social History Social History  Substance Use Topics  . Smoking status: Former Games developer  . Smokeless tobacco: Never Used  . Alcohol use No     Allergies   Patient has no known allergies.   Review of Systems Review of Systems  Constitutional: Negative for chills and fever.  HENT: Negative for congestion and facial swelling.   Eyes: Negative for discharge and visual disturbance.  Respiratory: Negative for shortness of breath.   Cardiovascular: Negative for chest pain and palpitations.  Gastrointestinal: Positive for abdominal pain. Negative for diarrhea and vomiting.  Genitourinary: Positive for dysuria. Negative for discharge and hematuria.  Musculoskeletal: Negative for arthralgias and myalgias.  Skin: Negative for color change and rash.  Neurological:  Negative for tremors, syncope and headaches.  Psychiatric/Behavioral: Negative for confusion and dysphoric mood.     Physical Exam Updated Vital Signs BP 129/76 (BP Location: Right Arm)   Pulse 73   Temp 97.7 F (36.5 C) (Oral)   Resp 16   SpO2 100%   Physical Exam  Constitutional: He is oriented to person, place, and time. He appears  well-developed and well-nourished.  HENT:  Head: Normocephalic and atraumatic.  Eyes: EOM are normal. Pupils are equal, round, and reactive to light.  Neck: Normal range of motion. Neck supple. No JVD present.  Cardiovascular: Normal rate and regular rhythm.  Exam reveals no gallop and no friction rub.   No murmur heard. Pulmonary/Chest: No respiratory distress. He has no wheezes.  Abdominal: He exhibits no distension. There is no rebound and no guarding.  Genitourinary:  Genitourinary Comments: Circumcised. Testicular cremasteric intact bilaterally. No testicular hernia. No mass or discharge.  Musculoskeletal: Normal range of motion.  Neurological: He is alert and oriented to person, place, and time.  Skin: No rash noted. No pallor.  Psychiatric: He has a normal mood and affect. His behavior is normal.  Nursing note and vitals reviewed.    ED Treatments / Results  DIAGNOSTIC STUDIES: Oxygen Saturation is 100% on RA, normal by my interpretation.    COORDINATION OF CARE: 12:12 AM Discussed treatment plan with pt at bedside and pt agreed to plan, which includes STD testing and antibiotics.   Labs (all labs ordered are listed, but only abnormal results are displayed) Labs Reviewed  URINALYSIS, ROUTINE W REFLEX MICROSCOPIC - Abnormal; Notable for the following:       Result Value   APPearance HAZY (*)    Hgb urine dipstick LARGE (*)    Bacteria, UA RARE (*)    All other components within normal limits  RPR  HIV ANTIBODY (ROUTINE TESTING)  GC/CHLAMYDIA PROBE AMP ([place]) NOT AT [facility]    EKG  EKG Interpretation None       Radiology No results found.  Procedures Procedures (including critical care time)  Medications Ordered in ED Medications  cefTRIAXone (ROCEPHIN) injection 250 mg (250 mg Intramuscular Given 03/18/17 0053)  azithromycin (ZITHROMAX) tablet 1,000 mg (1,000 mg Oral Given 03/18/17 0053)  sterile water (preservative free) injection (1 mL  Given 03/18/17  0054)     Initial Impression / Assessment and Plan / ED Course  I have reviewed the triage vital signs and the nursing notes.  Pertinent labs & imaging results that were available during my care of the patient were reviewed by me and considered in my medical decision making (see chart for details).     25 yo M With a chief complaint of dysuria. On for the past couple days. Denies flank pain denies fevers. On exam patient with no noted abdominal tenderness. GU exam with no frank discharge. Treated for possible STDs. Patient was also noted to have hematuria. Given urology follow-up.  2:21 AM:  I have discussed the diagnosis/risks/treatment options with the patient and family and believe the pt to be eligible for discharge home to follow-up with PCP. We also discussed returning to the ED immediately if new or worsening sx occur. We discussed the sx which are most concerning (e.g., sudden worsening pain, fever, inability to tolerate by mouth) that necessitate immediate return. Medications administered to the patient during their visit and any new prescriptions provided to the patient are listed below.  Medications given during this visit Medications  cefTRIAXone (ROCEPHIN) injection  250 mg (250 mg Intramuscular Given 03/18/17 0053)  azithromycin (ZITHROMAX) tablet 1,000 mg (1,000 mg Oral Given 03/18/17 0053)  sterile water (preservative free) injection (1 mL  Given 03/18/17 0054)     The patient appears reasonably screen and/or stabilized for discharge and I doubt any other medical condition or other [facility] requiring further screening, evaluation, or treatment in the ED at this time prior to discharge.    Final Clinical Impressions(s) / ED Diagnoses   Final diagnoses:  Dysuria  Other microscopic hematuria    New Prescriptions Discharge Medication List as of 03/18/2017 12:50 AM      I personally performed the services described in this documentation, which was scribed in my presence. The  recorded information has been reviewed and is accurate.     Melene Plan, DO 03/18/17 0221    Melene Plan, DO 03/18/17 0222

## 2017-03-18 NOTE — Discharge Instructions (Signed)
Follow up with urology as there was some blood in your urine.  Alternatively you could wait and have your urine retested by your PCP in a couple weeks to see if resolution post abx treatment here.

## 2017-03-19 LAB — GC/CHLAMYDIA PROBE AMP (~~LOC~~) NOT AT ARMC
Chlamydia: NEGATIVE
NEISSERIA GONORRHEA: NEGATIVE

## 2018-04-06 ENCOUNTER — Encounter (HOSPITAL_COMMUNITY): Payer: Self-pay

## 2018-04-06 ENCOUNTER — Other Ambulatory Visit: Payer: Self-pay

## 2018-04-06 ENCOUNTER — Emergency Department (HOSPITAL_COMMUNITY): Payer: Self-pay

## 2018-04-06 ENCOUNTER — Inpatient Hospital Stay (HOSPITAL_COMMUNITY)
Admission: EM | Admit: 2018-04-06 | Discharge: 2018-04-09 | DRG: 683 | Disposition: A | Discharge status: Home / Self Care | Payer: Self-pay | Attending: Internal Medicine | Admitting: Internal Medicine

## 2018-04-06 DIAGNOSIS — M545 Low back pain, unspecified: Secondary | ICD-10-CM | POA: Diagnosis present

## 2018-04-06 DIAGNOSIS — N10 Acute tubulo-interstitial nephritis: Secondary | ICD-10-CM

## 2018-04-06 DIAGNOSIS — R109 Unspecified abdominal pain: Secondary | ICD-10-CM

## 2018-04-06 DIAGNOSIS — F17200 Nicotine dependence, unspecified, uncomplicated: Secondary | ICD-10-CM | POA: Diagnosis present

## 2018-04-06 DIAGNOSIS — N390 Urinary tract infection, site not specified: Secondary | ICD-10-CM | POA: Diagnosis present

## 2018-04-06 DIAGNOSIS — L271 Localized skin eruption due to drugs and medicaments taken internally: Secondary | ICD-10-CM | POA: Diagnosis not present

## 2018-04-06 DIAGNOSIS — Y92230 Patient room in hospital as the place of occurrence of the external cause: Secondary | ICD-10-CM | POA: Diagnosis not present

## 2018-04-06 DIAGNOSIS — N179 Acute kidney failure, unspecified: Principal | ICD-10-CM

## 2018-04-06 DIAGNOSIS — E876 Hypokalemia: Secondary | ICD-10-CM | POA: Diagnosis not present

## 2018-04-06 DIAGNOSIS — T402X5A Adverse effect of other opioids, initial encounter: Secondary | ICD-10-CM | POA: Diagnosis not present

## 2018-04-06 DIAGNOSIS — T39315A Adverse effect of propionic acid derivatives, initial encounter: Secondary | ICD-10-CM | POA: Diagnosis present

## 2018-04-06 DIAGNOSIS — R51 Headache: Secondary | ICD-10-CM | POA: Diagnosis present

## 2018-04-06 LAB — CBC WITH DIFFERENTIAL/PLATELET
BASOS ABS: 0 10*3/uL (ref 0.0–0.1)
BASOS PCT: 0 %
EOS ABS: 0 10*3/uL (ref 0.0–0.7)
EOS PCT: 0 %
HCT: 41.3 % (ref 39.0–52.0)
Hemoglobin: 14.4 g/dL (ref 13.0–17.0)
Lymphocytes Relative: 11 %
Lymphs Abs: 1.6 10*3/uL (ref 0.7–4.0)
MCH: 31.9 pg (ref 26.0–34.0)
MCHC: 34.9 g/dL (ref 30.0–36.0)
MCV: 91.6 fL (ref 78.0–100.0)
MONO ABS: 1.1 10*3/uL — AB (ref 0.1–1.0)
Monocytes Relative: 8 %
NEUTROS ABS: 11.9 10*3/uL — AB (ref 1.7–7.7)
Neutrophils Relative %: 81 %
PLATELETS: 175 10*3/uL (ref 150–400)
RBC: 4.51 MIL/uL (ref 4.22–5.81)
RDW: 11.2 % — ABNORMAL LOW (ref 11.5–15.5)
WBC: 14.7 10*3/uL — ABNORMAL HIGH (ref 4.0–10.5)

## 2018-04-06 LAB — LIPASE, BLOOD: LIPASE: 27 U/L (ref 11–51)

## 2018-04-06 LAB — CBC
HEMATOCRIT: 41.3 % (ref 39.0–52.0)
HEMOGLOBIN: 14.7 g/dL (ref 13.0–17.0)
MCH: 32.5 pg (ref 26.0–34.0)
MCHC: 35.6 g/dL (ref 30.0–36.0)
MCV: 91.4 fL (ref 78.0–100.0)
Platelets: 179 10*3/uL (ref 150–400)
RBC: 4.52 MIL/uL (ref 4.22–5.81)
RDW: 11.2 % — ABNORMAL LOW (ref 11.5–15.5)
WBC: 13.9 10*3/uL — ABNORMAL HIGH (ref 4.0–10.5)

## 2018-04-06 LAB — COMPREHENSIVE METABOLIC PANEL
ALT: 15 U/L — ABNORMAL LOW (ref 17–63)
ANION GAP: 11 (ref 5–15)
AST: 27 U/L (ref 15–41)
Albumin: 4.5 g/dL (ref 3.5–5.0)
Alkaline Phosphatase: 51 U/L (ref 38–126)
BUN: 22 mg/dL — ABNORMAL HIGH (ref 6–20)
CHLORIDE: 102 mmol/L (ref 101–111)
CO2: 25 mmol/L (ref 22–32)
Calcium: 9.2 mg/dL (ref 8.9–10.3)
Creatinine, Ser: 3.35 mg/dL — ABNORMAL HIGH (ref 0.61–1.24)
GFR calc Af Amer: 28 mL/min — ABNORMAL LOW (ref >60)
GFR calc non Af Amer: 24 mL/min — ABNORMAL LOW (ref >60)
Glucose, Bld: 88 mg/dL (ref 65–99)
POTASSIUM: 3.5 mmol/L (ref 3.5–5.1)
SODIUM: 138 mmol/L (ref 135–145)
TOTAL PROTEIN: 7.7 g/dL (ref 6.5–8.1)
Total Bilirubin: 1 mg/dL (ref 0.3–1.2)

## 2018-04-06 LAB — URINALYSIS, ROUTINE W REFLEX MICROSCOPIC
BACTERIA UA: NONE SEEN
Bilirubin Urine: NEGATIVE
Glucose, UA: NEGATIVE mg/dL
Ketones, ur: NEGATIVE mg/dL
NITRITE: POSITIVE — AB
PROTEIN: 100 mg/dL — AB
RBC / HPF: >50 RBC/hpf — ABNORMAL HIGH (ref 0–5)
Specific Gravity, Urine: 1.011 (ref 1.005–1.030)
pH: 6 (ref 5.0–8.0)

## 2018-04-06 LAB — CK: CK TOTAL: 159 U/L (ref 49–397)

## 2018-04-06 LAB — RAPID URINE DRUG SCREEN, HOSP PERFORMED
Amphetamines: NOT DETECTED
Barbiturates: NOT DETECTED
Benzodiazepines: NOT DETECTED
Cocaine: NOT DETECTED
OPIATES: POSITIVE — AB
Tetrahydrocannabinol: NOT DETECTED

## 2018-04-06 LAB — PROTEIN / CREATININE RATIO, URINE
CREATININE, URINE: 82.54 mg/dL
PROTEIN CREATININE RATIO: 0.58 mg/mg{creat} — AB (ref 0.00–0.15)
Total Protein, Urine: 48 mg/dL

## 2018-04-06 MED ORDER — SODIUM CHLORIDE 0.9 % IV BOLUS
2000.0000 mL | Freq: Once | INTRAVENOUS | Status: AC
  Administered 2018-04-06: 2000 mL via INTRAVENOUS

## 2018-04-06 MED ORDER — ONDANSETRON HCL 4 MG PO TABS: 4.0000 mg | ORAL_TABLET | Freq: Four times a day (QID) | ORAL | Status: DC | PRN

## 2018-04-06 MED ORDER — SODIUM CHLORIDE 0.9 % IV SOLN
INTRAVENOUS | Status: AC
  Administered 2018-04-06 – 2018-04-07 (×4): via INTRAVENOUS

## 2018-04-06 MED ORDER — MORPHINE SULFATE (PF) 4 MG/ML IV SOLN
2.0000 mg | INTRAVENOUS | Status: DC | PRN
  Administered 2018-04-06: 2 mg via INTRAVENOUS
  Filled 2018-04-06: qty 1

## 2018-04-06 MED ORDER — MORPHINE SULFATE (PF) 4 MG/ML IV SOLN
2.0000 mg | INTRAVENOUS | Status: DC | PRN
  Administered 2018-04-07: 2 mg via INTRAVENOUS
  Filled 2018-04-06: qty 1

## 2018-04-06 MED ORDER — ACETAMINOPHEN 650 MG RE SUPP: 650.0000 mg | Freq: Four times a day (QID) | RECTAL | Status: DC | PRN

## 2018-04-06 MED ORDER — ENOXAPARIN SODIUM 40 MG/0.4ML ~~LOC~~ SOLN: 40.0000 mg | SUBCUTANEOUS | Status: DC

## 2018-04-06 MED ORDER — MORPHINE SULFATE (PF) 4 MG/ML IV SOLN
4.0000 mg | Freq: Once | INTRAVENOUS | Status: AC
  Administered 2018-04-06: 4 mg via INTRAVENOUS
  Filled 2018-04-06: qty 1

## 2018-04-06 MED ORDER — ONDANSETRON HCL 4 MG/2ML IJ SOLN: 4.0000 mg | Freq: Four times a day (QID) | INTRAMUSCULAR | Status: DC | PRN

## 2018-04-06 MED ORDER — MORPHINE SULFATE (PF) 4 MG/ML IV SOLN
1.0000 mg | INTRAVENOUS | Status: DC | PRN
  Administered 2018-04-06: 1 mg via INTRAVENOUS
  Filled 2018-04-06: qty 1

## 2018-04-06 MED ORDER — ACETAMINOPHEN 325 MG PO TABS
650.0000 mg | ORAL_TABLET | Freq: Four times a day (QID) | ORAL | Status: DC | PRN
  Administered 2018-04-08: 650 mg via ORAL
  Filled 2018-04-06: qty 2

## 2018-04-06 MED ORDER — SODIUM CHLORIDE 0.9 % IV SOLN
1.0000 g | Freq: Once | INTRAVENOUS | Status: AC
  Administered 2018-04-06: 1 g via INTRAVENOUS
  Filled 2018-04-06: qty 10

## 2018-04-06 NOTE — ED Triage Notes (Signed)
Patient c/o mid abdominal pain and bilateral lower back pain. Patient denies any penile drainage. Patient states he thought he had a bladder infection and took OTC pyridium.

## 2018-04-06 NOTE — H&P (Signed)
History and Physical    Jeffrey Heath ZOX:[id number] DOB: 1991/05/28 DOA: 04/06/2018  PCP: Patient, No Pcp Per  Patient coming from: Home.  Chief Complaint: Low back pain.  HPI: Jeffrey Heath is a 27 y.o. male with no significant past medical history presents to the ER with complaints of low back pain which is been ongoing for last 2 to 3 days.  Pains in the pain is mostly in the mid low back.  Pain is constant has not had any trauma and has not had done any unusual exercise recently.  Patient states he takes ibuprofen very often for his chronic headaches.  Denies any nausea vomiting or diarrhea.  Denies any hematuria fever or chills.  ED Course: In the ER UA is consistent with UTI and also has mild proteinuria and labs show creatinine of 3.3 which is increased from patient's baseline in our system measured in December 2015 which was 0.6.  Patient was given 2 L normal saline bolus in the ER blood cultures obtained along with urine culture started on ceftriaxone.  CT scan was showing no obstruction did show cystitis with surrounding inflammation.  Review of Systems: As per HPI, rest all negative.   History reviewed. No pertinent past medical history.  Past Surgical History:  Procedure Laterality Date  . head surgery       reports that he has been smoking.  He has never used smokeless tobacco. He reports that he drinks alcohol. He reports that he has current or past drug history.  No Known Allergies  Family History  Problem Relation Age of Onset  . CAD Neg Hx   . Diabetes Mellitus II Neg Hx     Prior to Admission medications   Medication Sig Start Date End Date Taking? Authorizing Provider  acetaminophen (TYLENOL) 500 MG tablet Take 1,000 mg by mouth daily as needed for moderate pain.   Yes [provider]  famotidine (PEPCID) 20 MG tablet Take 1 tablet (20 mg total) by mouth 2 (two) times daily. Patient not taking: Reported on 04/06/2018 12/04/13   Brandt Loosen, MD    famotidine (PEPCID) 20 MG tablet Take 1 tablet (20 mg total) by mouth 2 (two) times daily. Patient not taking: Reported on 04/06/2018 12/04/13   Brandt Loosen, MD  hydrOXYzine (ATARAX/VISTARIL) 25 MG tablet Take 1 tablet (25 mg total) by mouth every 6 (six) hours. Patient not taking: Reported on 04/06/2018 02/12/17   Arthor Captain, PA-C  predniSONE (DELTASONE) 20 MG tablet 3 tabs po daily x 3 days, then 2 tabs x 3 days, then 1.5 tabs x 3 days, then 1 tab x 3 days, then 0.5 tabs x 3 days Patient not taking: Reported on 04/06/2018 02/12/17   Arthor Captain, PA-C  traMADol (ULTRAM) 50 MG tablet Take 1 tablet (50 mg total) by mouth every 6 (six) hours as needed. Patient not taking: Reported on 04/06/2018 04/21/15   Antony Madura, PA-C  triamcinolone cream (KENALOG) 0.1 % Apply 1 application topically 2 (two) times daily. Patient not taking: Reported on 04/06/2018 02/12/17   Arthor Captain, PA-C    Physical Exam: Vitals:   04/06/18 1715 04/06/18 2016 04/06/18 2125 04/06/18 2126  BP:  (!) 139/100  137/88  Pulse:  70  77  Resp:  15  18  Temp:    99.7 F (37.6 C)  TempSrc:    Oral  SpO2:  99%  100%  Weight: 59 kg (130 lb)  63 kg (139 lb)   Height:  (  1.702 m)   (1.702 m)       Constitutional: Moderately built and nourished woman Vitals:   04/06/18 1715 04/06/18 2016 04/06/18 2125 04/06/18 2126  BP:  (!) 139/100  137/88  Pulse:  70  77  Resp:  15  18  Temp:    99.7 F (37.6 C)  TempSrc:    Oral  SpO2:  99%  100%  Weight: 59 kg (130 lb)  63 kg (139 lb)   Height:  (1.702 m)   (1.702 m)    Eyes: Anicteric no pallor. ENMT: No discharge from the ears eyes nose or mouth. Neck: No mass felt.  No neck rigidity. Respiratory: No rhonchi or crepitations. Cardiovascular: S1-S2 heard no murmurs appreciated. Abdomen: Soft nontender bowel sounds present. Musculoskeletal: No edema.  No joint effusion. Skin: No rash.  Skin appears warm. Neurologic: Alert awake oriented to time place  and person.  Moves all extremities. Psychiatric: Appears normal.  Normal affect.   Labs on Admission: I have personally reviewed following labs and imaging studies  CBC: Recent Labs  Lab 04/06/18 1722  WBC 14.7*  13.9*  NEUTROABS 11.9*  HGB 14.4  14.7  HCT 41.3  41.3  MCV 91.6  91.4  PLT 175  179   Basic Metabolic Panel: Recent Labs  Lab 04/06/18 1722  NA 138  K 3.5  CL 102  CO2 25  GLUCOSE 88  BUN 22*  CREATININE 3.35*  CALCIUM 9.2   GFR: Estimated Creatinine Clearance: 30.1 mL/min (A) (by C-G formula based on SCr of 3.35 mg/dL (H)). Liver Function Tests: Recent Labs  Lab 04/06/18 1722  AST 27  ALT 15*  ALKPHOS 51  BILITOT 1.0  PROT 7.7  ALBUMIN 4.5   Recent Labs  Lab 04/06/18 1722  LIPASE 27   No results for input(s): AMMONIA in the last 168 hours. Coagulation Profile: No results for input(s): INR, PROTIME in the last 168 hours. Cardiac Enzymes: Recent Labs  Lab 04/06/18 1839  CKTOTAL 159   BNP (last 3 results) No results for input(s): PROBNP in the last 8760 hours. HbA1C: No results for input(s): HGBA1C in the last 72 hours. CBG: No results for input(s): GLUCAP in the last 168 hours. Lipid Profile: No results for input(s): CHOL, HDL, LDLCALC, TRIG, CHOLHDL, LDLDIRECT in the last 72 hours. Thyroid Function Tests: No results for input(s): TSH, T4TOTAL, FREET4, T3FREE, THYROIDAB in the last 72 hours. Anemia Panel: No results for input(s): VITAMINB12, FOLATE, FERRITIN, TIBC, IRON, RETICCTPCT in the last 72 hours. Urine analysis:    Component Value Date/Time   COLORURINE AMBER (A) 04/06/2018 1731   APPEARANCEUR HAZY (A) 04/06/2018 1731   LABSPEC 1.011 04/06/2018 1731   PHURINE 6.0 04/06/2018 1731   GLUCOSEU NEGATIVE 04/06/2018 1731   HGBUR LARGE (A) 04/06/2018 1731   BILIRUBINUR NEGATIVE 04/06/2018 1731   KETONESUR NEGATIVE 04/06/2018 1731   PROTEINUR 100 (A) 04/06/2018 1731   UROBILINOGEN 1.0 11/16/2014 1610   NITRITE POSITIVE (A)  04/06/2018 1731   LEUKOCYTESUR LARGE (A) 04/06/2018 1731   Sepsis Labs: (procalcitonin:4,lacticidven:4) )No results found for this or any previous visit (from the past 240 hour(s)).   Radiological Exams on Admission: Ct Renal Stone Study  Result Date: 04/06/2018 CLINICAL DATA:  mid abdominal pain and bilateral lower back pain. Patient denies any penile drainage. Patient states he thought he had a bladder infection and took OTC pyridium EXAM: CT ABDOMEN AND PELVIS WITHOUT CONTRAST TECHNIQUE: Multidetector CT imaging of the abdomen and  pelvis was performed following the standard protocol without IV contrast. COMPARISON:  None. FINDINGS: Lower chest: Clear lung bases.  Heart normal size. Hepatobiliary: No focal liver abnormality is seen. No gallstones, gallbladder wall thickening, or biliary dilatation. Pancreas: Unremarkable. No pancreatic ductal dilatation or surrounding inflammatory changes. Spleen: Normal in size without focal abnormality. Adrenals/Urinary Tract: No adrenal masses. Kidneys normal size, orientation and position. No renal masses, stones or hydronephrosis. Ureters are normal in course and in caliber. Bladder wall is thickened. Mild hazy inflammation is seen in the perivesicular fat. Stomach/Bowel: Stomach is within normal limits. Appendix appears normal. No evidence of bowel wall thickening, distention, or inflammatory changes. Vascular/Lymphatic: No significant vascular findings are present. No enlarged abdominal or pelvic lymph nodes. Reproductive: Unremarkable. Other: No abdominal wall hernia or abnormality. No abdominopelvic ascites. Musculoskeletal: No acute or significant osseous findings. IMPRESSION: 1. Cystitis reflected by bladder wall thickening and inflammation in the adjacent fat. Exam otherwise unremarkable. 2. No renal or ureteral stones. Normal unenhanced CT appearance of the kidneys. Electronically Signed   By: Amie Portland M.D.   On: 04/06/2018 19:20      Assessment/Plan Principal Problem:   ARF (acute renal failure) (HCC) Active Problems:   Acute lower UTI   Low back pain    1. Acute renal failure -patient does take ibuprofen which could contribute to patient's renal failure.  Patient's urine does show some proteins and RBCs.  WBC comes but no definite casts.  Patient is receiving fluid at this time advised patient not to take ibuprofen.  If creatinine does not improve will need nephrology consult.  CT scan does not show any obstruction.  Will check urine protein creatinine ratio. 2. UTI -placed on ceftriaxone.  Follow cultures. 3. Low back pain -cause not clear.  Could be from UTI.  Closely observe.   DVT prophylaxis: We will keep patient on SCDs in case patient needs biopsy. Code Status: Full code. Family Communication: Discussed with patient. Disposition Plan: Home. Consults called: None. Admission status: Inpatient.   Eduard Clos MD [facility] Pager [phone].  If 7PM-7AM, please contact night-coverage [email or web address] Password [facility]  04/06/2018, 10:27 PM

## 2018-04-06 NOTE — ED Notes (Signed)
ED TO INPATIENT HANDOFF REPORT  Name/Age/Gender Jeffrey Heath 27 y.o. male  Code Status   Home/SNF/Other Home  Chief Complaint Abdominal and Back Pain    Level of Care/Admitting Diagnosis ED Disposition    ED Disposition Condition Comment   Admit  Hospital Area: [place] [[id number]]  Level of Care: Telemetry [5]  Admit to tele based on following criteria: Monitor for Ischemic changes  Diagnosis: ARF (acute renal failure) [facility]) [[id number]]  Admitting Physician: Rise Patience [phone]  Attending Physician: Rise Patience [email or web address]  PT Class (Do Not Modify): Observation [104]  PT Acc Code (Do Not Modify): Observation [10022]       Medical History History reviewed. No pertinent past medical history.  Allergies No Known Allergies  IV Location/Drains/Wounds Patient Lines/Drains/Airways Status   Active Line/Drains/Airways    Name:   Placement date:   Placement time:   Site:   Days:   Peripheral IV 04/06/18 Right Antecubital   04/06/18    1937    Antecubital   less than 1          Labs/Imaging Results for orders placed or performed during the hospital encounter of 04/06/18 (from the past 48 hour(s))  Lipase, blood     Status: None   Collection Time: 04/06/18  5:22 PM  Result Value Ref Range   Lipase 27 11 - 51 U/L    Comment: Performed at [facility], [place] [address], [place], [place] [address]  Comprehensive metabolic panel     Status: Abnormal   Collection Time: 04/06/18  5:22 PM  Result Value Ref Range   Sodium 138 135 - 145 mmol/L   Potassium 3.5 3.5 - 5.1 mmol/L   Chloride 102 101 - 111 mmol/L   CO2 25 22 - 32 mmol/L   Glucose, Bld 88 65 - 99 mg/dL   BUN 22 (H) 6 - 20 mg/dL   Creatinine, Ser 3.35 (H) 0.61 - 1.24 mg/dL   Calcium 9.2 8.9 - 10.3 mg/dL   Total Protein 7.7 6.5 - 8.1 g/dL   Albumin 4.5 3.5 - 5.0 g/dL   AST 27 15 - 41 U/L   ALT 15 (L) 17 - 63 U/L   Alkaline Phosphatase 51 38 - 126 U/L   Total  Bilirubin 1.0 0.3 - 1.2 mg/dL   GFR calc non Af Amer 24 (L) >60 mL/min   GFR calc Af Amer 28 (L) >60 mL/min    Comment: (NOTE) The eGFR has been calculated using the CKD EPI equation. This calculation has not been validated in all clinical situations. eGFR's persistently <60 mL/min signify possible Chronic Kidney Disease.    Anion gap 11 5 - 15    Comment: Performed at [facility], [place] [address], [place], [place] [address]  CBC     Status: Abnormal   Collection Time: 04/06/18  5:22 PM  Result Value Ref Range   WBC 13.9 (H) 4.0 - 10.5 K/uL   RBC 4.52 4.22 - 5.81 MIL/uL   Hemoglobin 14.7 13.0 - 17.0 g/dL   HCT 41.3 39.0 - 52.0 %   MCV 91.4 78.0 - 100.0 fL   MCH 32.5 26.0 - 34.0 pg   MCHC 35.6 30.0 - 36.0 g/dL   RDW 11.2 (L) 11.5 - 15.5 %   Platelets 179 150 - 400 K/uL    Comment: Performed at [facility], [place] [address], [place], [place] [address]  CBC with Differential/Platelet  Status: Abnormal   Collection Time: 04/06/18  5:22 PM  Result Value Ref Range   WBC 14.7 (H) 4.0 - 10.5 K/uL   RBC 4.51 4.22 - 5.81 MIL/uL   Hemoglobin 14.4 13.0 - 17.0 g/dL   HCT 41.3 39.0 - 52.0 %   MCV 91.6 78.0 - 100.0 fL   MCH 31.9 26.0 - 34.0 pg   MCHC 34.9 30.0 - 36.0 g/dL   RDW 11.2 (L) 11.5 - 15.5 %   Platelets 175 150 - 400 K/uL   Neutrophils Relative % 81 %   Neutro Abs 11.9 (H) 1.7 - 7.7 K/uL   Lymphocytes Relative 11 %   Lymphs Abs 1.6 0.7 - 4.0 K/uL   Monocytes Relative 8 %   Monocytes Absolute 1.1 (H) 0.1 - 1.0 K/uL   Eosinophils Relative 0 %   Eosinophils Absolute 0.0 0.0 - 0.7 K/uL   Basophils Relative 0 %   Basophils Absolute 0.0 0.0 - 0.1 K/uL    Comment: Performed at [facility], [place] [address], [place], [place] [address]  Urinalysis, Routine w reflex microscopic     Status: Abnormal   Collection Time: 04/06/18  5:31 PM  Result Value Ref Range   Color, Urine AMBER (A) YELLOW    Comment: BIOCHEMICALS MAY BE  AFFECTED BY COLOR   APPearance HAZY (A) CLEAR   Specific Gravity, Urine 1.011 1.005 - 1.030   pH 6.0 5.0 - 8.0   Glucose, UA NEGATIVE NEGATIVE mg/dL   Hgb urine dipstick LARGE (A) NEGATIVE   Bilirubin Urine NEGATIVE NEGATIVE   Ketones, ur NEGATIVE NEGATIVE mg/dL   Protein, ur 100 (A) NEGATIVE mg/dL   Nitrite POSITIVE (A) NEGATIVE   Leukocytes, UA LARGE (A) NEGATIVE   RBC / HPF >50 (H) 0 - 5 RBC/hpf   WBC, UA >50 (H) 0 - 5 WBC/hpf   Bacteria, UA NONE SEEN NONE SEEN   Squamous Epithelial / LPF 0-5 0 - 5    Comment: Please note change in reference range.   WBC Clumps PRESENT     Comment: Performed at [facility], [place] [address], [place], [place] [address]  CK     Status: None   Collection Time: 04/06/18  6:39 PM  Result Value Ref Range   Total CK 159 49 - 397 U/L    Comment: Performed at [facility], [place] [address] [place], [place] [address]   Ct Renal Stone Study  Result Date: 04/06/2018 CLINICAL DATA:  mid abdominal pain and bilateral lower back pain. Patient denies any penile drainage. Patient states he thought he had a bladder infection and took OTC pyridium EXAM: CT ABDOMEN AND PELVIS WITHOUT CONTRAST TECHNIQUE: Multidetector CT imaging of the abdomen and pelvis was performed following the standard protocol without IV contrast. COMPARISON:  None. FINDINGS: Lower chest: Clear lung bases.  Heart normal size. Hepatobiliary: No focal liver abnormality is seen. No gallstones, gallbladder wall thickening, or biliary dilatation. Pancreas: Unremarkable. No pancreatic ductal dilatation or surrounding inflammatory changes. Spleen: Normal in size without focal abnormality. Adrenals/Urinary Tract: No adrenal masses. Kidneys normal size, orientation and position. No renal masses, stones or hydronephrosis. Ureters are normal in course and in caliber. Bladder wall is thickened. Mild hazy inflammation is seen in the perivesicular fat. Stomach/Bowel: Stomach is  within normal limits. Appendix appears normal. No evidence of bowel wall thickening, distention, or inflammatory changes. Vascular/Lymphatic: No significant vascular findings are present. No enlarged abdominal or pelvic lymph nodes. Reproductive: Unremarkable.  Other: No abdominal wall hernia or abnormality. No abdominopelvic ascites. Musculoskeletal: No acute or significant osseous findings. IMPRESSION: 1. Cystitis reflected by bladder wall thickening and inflammation in the adjacent fat. Exam otherwise unremarkable. 2. No renal or ureteral stones. Normal unenhanced CT appearance of the kidneys. Electronically Signed   By: Lajean Manes M.D.   On: 04/06/2018 19:20    Pending Labs Unresulted Labs (From admission, onward)   Start     Ordered   04/06/18 1856  Culture, blood (routine x 2)  BLOOD CULTURE X 2,   STAT     04/06/18 1855   04/06/18 1802  Urine culture  STAT,   STAT     04/06/18 1801      Vitals/Pain Today's Vitals   04/06/18 1713 04/06/18 1715 04/06/18 2016  BP: 121/89  (!) 139/100  Pulse: 90  70  Resp: 16  15  Temp: 99.2 F (37.3 C)    TempSrc: Oral    SpO2: 99%  99%  Weight:  130 lb (59 kg)   Height:  _0  (1.702 m)   PainSc: 8       Isolation Precautions No active isolations  Medications Medications  sodium chloride 0.9 % bolus 2,000 mL (0 mLs Intravenous Stopped 04/06/18 2044)  cefTRIAXone (ROCEPHIN) 1 g in sodium chloride 0.9 % 100 mL IVPB (0 g Intravenous Stopped 04/06/18 2000)    Mobility walks

## 2018-04-06 NOTE — ED Provider Notes (Signed)
[facility] Provider Note   CSN: [id number] Arrival date & time: 04/06/18  1559     History   Chief Complaint Chief Complaint  Patient presents with  . Abdominal Pain  . Back Pain    HPI Jeffrey Heath is a 27 y.o. male who presents today for evaluation of abdominal pain, and back pain.  He reports that he has been having the symptoms for the past 3 days.  His pain has not moved or radiated.  His pain is worse on the right back and on the right side of his abdomen.  He does not have any personal history of kidney stones.  He did report that it felt like he was having a hard time peeing 2 days ago so he has taken Pyridium for the past 2 days.  He denies any concern for STD, no penile discharge or testicular pain.  He denies any history of abdominal surgeries.  He reports mildly decreased appetite, no fevers or chills at home, no nausea vomiting or diarrhea.  HPI  History reviewed. No pertinent past medical history.  Patient Active Problem List   Diagnosis Date Noted  . ARF (acute renal failure) (HCC) 04/06/2018  . Acute lower UTI 04/06/2018  . Low back pain 04/06/2018    Past Surgical History:  Procedure Laterality Date  . head surgery          Home Medications    Prior to Admission medications   Medication Sig Start Date End Date Taking? Authorizing Provider  acetaminophen (TYLENOL) 500 MG tablet Take 1,000 mg by mouth daily as needed for moderate pain.   Yes [provider]  famotidine (PEPCID) 20 MG tablet Take 1 tablet (20 mg total) by mouth 2 (two) times daily. Patient not taking: Reported on 04/06/2018 12/04/13   Brandt Loosen, MD  famotidine (PEPCID) 20 MG tablet Take 1 tablet (20 mg total) by mouth 2 (two) times daily. Patient not taking: Reported on 04/06/2018 12/04/13   Brandt Loosen, MD  hydrOXYzine (ATARAX/VISTARIL) 25 MG tablet Take 1 tablet (25 mg total) by mouth every 6 (six) hours. Patient not taking:  Reported on 04/06/2018 02/12/17   Arthor Captain, PA-C  predniSONE (DELTASONE) 20 MG tablet 3 tabs po daily x 3 days, then 2 tabs x 3 days, then 1.5 tabs x 3 days, then 1 tab x 3 days, then 0.5 tabs x 3 days Patient not taking: Reported on 04/06/2018 02/12/17   Arthor Captain, PA-C  traMADol (ULTRAM) 50 MG tablet Take 1 tablet (50 mg total) by mouth every 6 (six) hours as needed. Patient not taking: Reported on 04/06/2018 04/21/15   Antony Madura, PA-C  triamcinolone cream (KENALOG) 0.1 % Apply 1 application topically 2 (two) times daily. Patient not taking: Reported on 04/06/2018 02/12/17   Arthor Captain, PA-C    Family History Family History  Problem Relation Age of Onset  . CAD Neg Hx   . Diabetes Mellitus II Neg Hx     Social History Social History   Tobacco Use  . Smoking status: Current Every Day Smoker  . Smokeless tobacco: Never Used  Substance Use Topics  . Alcohol use: Yes    Comment: Occasionally.  . Drug use: Not Currently     Allergies   Patient has no known allergies.   Review of Systems Review of Systems  Constitutional: Negative for chills and fever.  HENT: Negative for congestion.   Respiratory: Negative for chest tightness and shortness of breath.  Cardiovascular: Negative for chest pain and palpitations.  Gastrointestinal: Positive for abdominal pain. Negative for diarrhea and nausea.  Genitourinary: Positive for dysuria, flank pain and frequency. Negative for discharge, genital sores, hematuria, penile pain, penile swelling, scrotal swelling, testicular pain and urgency.  Musculoskeletal: Positive for back pain. Negative for neck pain and neck stiffness.  Skin: Negative for color change, rash and wound.  All other systems reviewed and are negative.    Physical Exam Updated Vital Signs BP 137/88 (BP Location: Left Arm)   Pulse 77   Temp 99.7 F (37.6 C) (Oral)   Resp 18   Ht  (1.702 m)   Wt 63 kg (139 lb)   SpO2 100%   BMI 21.77 kg/m    Physical Exam  Constitutional: He appears well-developed and well-nourished.  HENT:  Head: Normocephalic and atraumatic.  Eyes: Conjunctivae are normal.  Neck: Neck supple.  Cardiovascular: Normal rate and regular rhythm.  No murmur heard. Pulmonary/Chest: Effort normal and breath sounds normal. No respiratory distress.  Abdominal: Soft. Normal appearance and bowel sounds are normal. There is tenderness in the right lower quadrant and periumbilical area. There is CVA tenderness (Right sided).  Musculoskeletal: He exhibits no edema.  Diffuse lumbar back TTP, pain is worse with percussion over right CVA than with palpation over right CVA.   Neurological: He is alert.  Skin: Skin is warm and dry.  Psychiatric: He has a normal mood and affect.  Nursing note and vitals reviewed.    ED Treatments / Results  Labs (all labs ordered are listed, but only abnormal results are displayed) Labs Reviewed  COMPREHENSIVE METABOLIC PANEL - Abnormal; Notable for the following components:      Result Value   BUN 22 (*)    Creatinine, Ser 3.35 (*)    ALT 15 (*)    GFR calc non Af Amer 24 (*)    GFR calc Af Amer 28 (*)    All other components within normal limits  CBC - Abnormal; Notable for the following components:   WBC 13.9 (*)    RDW 11.2 (*)    All other components within normal limits  URINALYSIS, ROUTINE W REFLEX MICROSCOPIC - Abnormal; Notable for the following components:   Color, Urine AMBER (*)    APPearance HAZY (*)    Hgb urine dipstick LARGE (*)    Protein, ur 100 (*)    Nitrite POSITIVE (*)    Leukocytes, UA LARGE (*)    RBC / HPF >50 (*)    WBC, UA >50 (*)    All other components within normal limits  CBC WITH DIFFERENTIAL/PLATELET - Abnormal; Notable for the following components:   WBC 14.7 (*)    RDW 11.2 (*)    Neutro Abs 11.9 (*)    Monocytes Absolute 1.1 (*)    All other components within normal limits  PROTEIN / CREATININE RATIO, URINE - Abnormal; Notable for  the following components:   Protein Creatinine Ratio 0.58 (*)    All other components within normal limits  RAPID URINE DRUG SCREEN, HOSP PERFORMED - Abnormal; Notable for the following components:   Opiates POSITIVE (*)    All other components within normal limits  URINE CULTURE  CULTURE, BLOOD (ROUTINE X 2)  CULTURE, BLOOD (ROUTINE X 2)  LIPASE, BLOOD  CK  HIV ANTIBODY (ROUTINE TESTING)  BASIC METABOLIC PANEL  CBC  HEPATIC FUNCTION PANEL  GC/CHLAMYDIA PROBE AMP ([place]) NOT AT [facility]    EKG None  Radiology Ct Renal Stone Study  Result Date: 04/06/2018 CLINICAL DATA:  mid abdominal pain and bilateral lower back pain. Patient denies any penile drainage. Patient states he thought he had a bladder infection and took OTC pyridium EXAM: CT ABDOMEN AND PELVIS WITHOUT CONTRAST TECHNIQUE: Multidetector CT imaging of the abdomen and pelvis was performed following the standard protocol without IV contrast. COMPARISON:  None. FINDINGS: Lower chest: Clear lung bases.  Heart normal size. Hepatobiliary: No focal liver abnormality is seen. No gallstones, gallbladder wall thickening, or biliary dilatation. Pancreas: Unremarkable. No pancreatic ductal dilatation or surrounding inflammatory changes. Spleen: Normal in size without focal abnormality. Adrenals/Urinary Tract: No adrenal masses. Kidneys normal size, orientation and position. No renal masses, stones or hydronephrosis. Ureters are normal in course and in caliber. Bladder wall is thickened. Mild hazy inflammation is seen in the perivesicular fat. Stomach/Bowel: Stomach is within normal limits. Appendix appears normal. No evidence of bowel wall thickening, distention, or inflammatory changes. Vascular/Lymphatic: No significant vascular findings are present. No enlarged abdominal or pelvic lymph nodes. Reproductive: Unremarkable. Other: No abdominal wall hernia or abnormality. No abdominopelvic ascites. Musculoskeletal: No acute or significant  osseous findings. IMPRESSION: 1. Cystitis reflected by bladder wall thickening and inflammation in the adjacent fat. Exam otherwise unremarkable. 2. No renal or ureteral stones. Normal unenhanced CT appearance of the kidneys. Electronically Signed   By: Amie Portland M.D.   On: 04/06/2018 19:20    Procedures Procedures (including critical care time)  Medications Ordered in ED Medications  0.9 %  sodium chloride infusion ( Intravenous New Bag/Given 04/06/18 2216)  acetaminophen (TYLENOL) tablet 650 mg (has no administration in time range)    Or  acetaminophen (TYLENOL) suppository 650 mg (has no administration in time range)  ondansetron (ZOFRAN) tablet 4 mg (has no administration in time range)    Or  ondansetron (ZOFRAN) injection 4 mg (has no administration in time range)  morphine 4 MG/ML injection 2 mg (has no administration in time range)  sodium chloride 0.9 % bolus 2,000 mL (0 mLs Intravenous Stopped 04/06/18 2044)  cefTRIAXone (ROCEPHIN) 1 g in sodium chloride 0.9 % 100 mL IVPB (0 g Intravenous Stopped 04/06/18 2000)  morphine 4 MG/ML injection 4 mg (4 mg Intravenous Given 04/06/18 2331)     Initial Impression / Assessment and Plan / ED Course  I have reviewed the triage vital signs and the nursing notes.  Pertinent labs & imaging results that were available during my care of the patient were reviewed by me and considered in my medical decision making (see chart for details).  Clinical Course as of Apr 07 1  Sat Apr 06, 2018  1816 Change scan from CT with contrast to CT renal study, ordered 2 L of fluids.  Creatinine(!): 3.35 [EH]  2019 Spoke with hospitalist who will admit patient.    [EH]    Clinical Course User Index [EH] Cristina Gong, PA-C   Cindi Carbon since today for evaluation of abdominal pain and back pain.  He had slight leukocytosis with a white count of 14.7, neutrophils of 11.9.  Urine consistent with infection with large blood, 100 protein, nitrate  positive.  Labs obtained showing a significant elevation in creatinine at 3.35, BUN 22, GFR 28.  He has no known history of kidney problems.  Patient was treated with IV fluids.  CT renal stone study was performed showing no acute abnormalities.  CK is not elevated.  Urine culture sent.  He was started on IV rocephin for  his probable pyelonephritis.  Given significant elevation in creatinine from normal to 3.35 hospitalist consulted for admission who agreed to admit patient.  This patient was discussed with Dr. Hyacinth Meeker who agreed with my plan to admit.    Final Clinical Impressions(s) / ED Diagnoses   Final diagnoses:  Acute renal failure, unspecified acute renal failure type (HCC)  Abdominal pain, unspecified abdominal location  Acute pyelonephritis    ED Discharge Orders    None       Norman Clay 04/07/18 0009    Eber Hong, MD 04/07/18 1452

## 2018-04-06 NOTE — ED Notes (Signed)
Patient transported to CT 

## 2018-04-07 LAB — BASIC METABOLIC PANEL
Anion gap: 9 (ref 5–15)
BUN: 20 mg/dL (ref 6–20)
CALCIUM: 8.4 mg/dL — AB (ref 8.9–10.3)
CO2: 25 mmol/L (ref 22–32)
Chloride: 106 mmol/L (ref 101–111)
Creatinine, Ser: 3.04 mg/dL — ABNORMAL HIGH (ref 0.61–1.24)
GFR calc Af Amer: 31 mL/min — ABNORMAL LOW (ref >60)
GFR, EST NON AFRICAN AMERICAN: 27 mL/min — AB (ref >60)
GLUCOSE: 86 mg/dL (ref 65–99)
Potassium: 3.3 mmol/L — ABNORMAL LOW (ref 3.5–5.1)
Sodium: 140 mmol/L (ref 135–145)

## 2018-04-07 LAB — HEPATIC FUNCTION PANEL
ALBUMIN: 4 g/dL (ref 3.5–5.0)
ALT: 13 U/L — ABNORMAL LOW (ref 17–63)
AST: 23 U/L (ref 15–41)
Alkaline Phosphatase: 46 U/L (ref 38–126)
Bilirubin, Direct: 0.2 mg/dL (ref 0.1–0.5)
Indirect Bilirubin: 0.4 mg/dL (ref 0.3–0.9)
TOTAL PROTEIN: 6.8 g/dL (ref 6.5–8.1)
Total Bilirubin: 0.6 mg/dL (ref 0.3–1.2)

## 2018-04-07 LAB — CBC
HCT: 37.2 % — ABNORMAL LOW (ref 39.0–52.0)
Hemoglobin: 12.4 g/dL — ABNORMAL LOW (ref 13.0–17.0)
MCH: 31.2 pg (ref 26.0–34.0)
MCHC: 33.3 g/dL (ref 30.0–36.0)
MCV: 93.5 fL (ref 78.0–100.0)
Platelets: 157 10*3/uL (ref 150–400)
RBC: 3.98 MIL/uL — ABNORMAL LOW (ref 4.22–5.81)
RDW: 11.3 % — AB (ref 11.5–15.5)
WBC: 12.4 10*3/uL — ABNORMAL HIGH (ref 4.0–10.5)

## 2018-04-07 LAB — HIV ANTIBODY (ROUTINE TESTING W REFLEX): HIV SCREEN 4TH GENERATION: NONREACTIVE

## 2018-04-07 MED ORDER — HYDROXYZINE HCL 25 MG PO TABS
25.0000 mg | ORAL_TABLET | Freq: Three times a day (TID) | ORAL | Status: DC | PRN
  Administered 2018-04-07 – 2018-04-09 (×4): 25 mg via ORAL
  Filled 2018-04-07 (×4): qty 1

## 2018-04-07 MED ORDER — DIPHENHYDRAMINE HCL 25 MG PO CAPS
25.0000 mg | ORAL_CAPSULE | Freq: Once | ORAL | Status: AC
  Administered 2018-04-07: 25 mg via ORAL
  Filled 2018-04-07: qty 1

## 2018-04-07 MED ORDER — MORPHINE SULFATE (PF) 4 MG/ML IV SOLN
4.0000 mg | INTRAVENOUS | Status: DC | PRN
  Administered 2018-04-07 – 2018-04-09 (×9): 4 mg via INTRAVENOUS
  Filled 2018-04-07 (×10): qty 1

## 2018-04-07 MED ORDER — HYDROCORTISONE 1 % EX CREA
TOPICAL_CREAM | Freq: Three times a day (TID) | CUTANEOUS | Status: DC | PRN
Start: 2018-04-07 — End: 2018-04-08
  Administered 2018-04-07: 20:00:00 via TOPICAL
  Filled 2018-04-07: qty 28

## 2018-04-07 MED ORDER — SODIUM CHLORIDE 0.9 % IV SOLN
1.0000 g | INTRAVENOUS | Status: DC
  Administered 2018-04-07 – 2018-04-08 (×2): 1 g via INTRAVENOUS
  Filled 2018-04-07 (×2): qty 1

## 2018-04-07 MED ORDER — POTASSIUM CHLORIDE CRYS ER 20 MEQ PO TBCR
40.0000 meq | EXTENDED_RELEASE_TABLET | Freq: Once | ORAL | Status: AC
  Administered 2018-04-07: 40 meq via ORAL
  Filled 2018-04-07: qty 2

## 2018-04-07 MED ORDER — HYDROMORPHONE HCL 1 MG/ML IJ SOLN
0.5000 mg | Freq: Once | INTRAMUSCULAR | Status: AC
  Administered 2018-04-07: 0.5 mg via INTRAVENOUS
  Filled 2018-04-07: qty 0.5

## 2018-04-07 MED ORDER — DIPHENHYDRAMINE HCL 25 MG PO CAPS
25.0000 mg | ORAL_CAPSULE | Freq: Four times a day (QID) | ORAL | Status: DC | PRN
  Administered 2018-04-07 – 2018-04-08 (×2): 25 mg via ORAL
  Filled 2018-04-07 (×2): qty 1

## 2018-04-07 MED ORDER — OXYCODONE-ACETAMINOPHEN 5-325 MG PO TABS
1.0000 | ORAL_TABLET | ORAL | Status: DC | PRN
  Administered 2018-04-07 – 2018-04-08 (×6): 2 via ORAL
  Filled 2018-04-07 (×6): qty 2

## 2018-04-07 NOTE — Progress Notes (Signed)
Pt c/o itching and a rash on his upper back that he states "started at one today". Day shift nurse gave him a one time dose of benadryl see MAR. Pt says itching has progressively gotten worse and is now all over his body. Writer notes rash on pt's upper back. On call Bodenheimer made aware and ordered benadryl and hydrocortisone cream. Medication given. Will continue to monitor pt.

## 2018-04-07 NOTE — Progress Notes (Signed)
Pt's BP is currently 134/102 while resting in bed. Pt c/o of bilateral flank pain 8/10. Pain medicine just given to pt. On call Bodenheimer notified of BP. Will continue to pt.

## 2018-04-07 NOTE — Progress Notes (Signed)
PROGRESS NOTE    Jeffrey Heath  ZOX:[id number] DOB: [birth year] DOA: 04/06/2018 PCP: Patient, No Pcp Per    Brief Narrative: Jeffrey Heath is a 27 y.o. male with no significant past medical history presents to the ER with complaints of low back pain which is been ongoing for last 2 to 3 days.  In the ER UA is consistent with UTI and also has mild proteinuria and labs show creatinine of 3.3 which is increased from patient's baseline in our system measured in December 2015 which was 0.6.  He was admitted for acute renal failure and UTI.    Assessment & Plan:    Principal Problem:   ARF (acute renal failure) (HCC) Active Problems:   Acute lower UTI   Low back pain   ACUTE RENAL FAILURE; Secondary to NSAIDS AND UTI.  Hydrate and his renal parameters are improving.  CT does not show obstruction.   UTI" Urine cultures pending.  On rocephin.    Low back pain:  Pain control.    Hypokalemia: Replaced.    DVT prophylaxis: scd's Code Status: full code.  Family Communication: none at bedside.  Disposition Plan: pending urine cultures.    Consultants:  None  Procedures: none.   Antimicrobials: rocephin.   Subjective: Pain better with percocet.   Objective: Vitals:   04/07/18 0104 04/07/18 0446 04/07/18 0448 04/07/18 1439  BP: 122/79 (!) 134/102  (!) 133/98  Pulse: 79 69  (!) 58  Resp:  20  16  Temp:  98.9 F (37.2 C)    TempSrc:      SpO2: 96% 99%  100%  Weight:   65.2 kg (143 lb 12.8 oz)   Height:        Intake/Output Summary (Last 24 hours) at 04/07/2018 1751 Last data filed at 04/07/2018 1400 Gross per 24 hour  Intake 5336.67 ml  Output -  Net 5336.67 ml   Filed Weights   04/06/18 1715 04/06/18 2125 04/07/18 0448  Weight: 59 kg (130 lb) 63 kg (139 lb) 65.2 kg (143 lb 12.8 oz)    Examination:  General exam: Appears calm and comfortable  Respiratory system: Clear to auscultation. Respiratory effort normal. Cardiovascular system: S1 & S2 heard,  RRR. No JVD, murmurs, rubs, gallops or clicks. No pedal edema. Gastrointestinal system: Abdomen is nondistended, soft and nontender. No organomegaly or masses felt. Normal bowel sounds heard. Central nervous system: Alert and oriented. No focal neurological deficits. Extremities: Symmetric 5 x 5 power. Skin: No rashes, lesions or ulcers Psychiatry:  Mood & affect appropriate.     Data Reviewed: I have personally reviewed following labs and imaging studies  CBC: Recent Labs  Lab 04/06/18 1722 04/07/18 0443  WBC 14.7*  13.9* 12.4*  NEUTROABS 11.9*  --   HGB 14.4  14.7 12.4*  HCT 41.3  41.3 37.2*  MCV 91.6  91.4 93.5  PLT 175  179 157   Basic Metabolic Panel: Recent Labs  Lab 04/06/18 1722 04/07/18 0443  NA 138 140  K 3.5 3.3*  CL 102 106  CO2 25 25  GLUCOSE 88 86  BUN 22* 20  CREATININE 3.35* 3.04*  CALCIUM 9.2 8.4*   GFR: Estimated Creatinine Clearance: 34.3 mL/min (A) (by C-G formula based on SCr of 3.04 mg/dL (H)). Liver Function Tests: Recent Labs  Lab 04/06/18 1722 04/07/18 0443  AST 27 23  ALT 15* 13*  ALKPHOS 51 46  BILITOT 1.0 0.6  PROT 7.7 6.8  ALBUMIN 4.5 4.0   Recent  Labs  Lab 04/06/18 1722  LIPASE 27   No results for input(s): AMMONIA in the last 168 hours. Coagulation Profile: No results for input(s): INR, PROTIME in the last 168 hours. Cardiac Enzymes: Recent Labs  Lab 04/06/18 1839  CKTOTAL 159   BNP (last 3 results) No results for input(s): PROBNP in the last 8760 hours. HbA1C: No results for input(s): HGBA1C in the last 72 hours. CBG: No results for input(s): GLUCAP in the last 168 hours. Lipid Profile: No results for input(s): CHOL, HDL, LDLCALC, TRIG, CHOLHDL, LDLDIRECT in the last 72 hours. Thyroid Function Tests: No results for input(s): TSH, T4TOTAL, FREET4, T3FREE, THYROIDAB in the last 72 hours. Anemia Panel: No results for input(s): VITAMINB12, FOLATE, FERRITIN, TIBC, IRON, RETICCTPCT in the last 72 hours. Sepsis  Labs: No results for input(s): PROCALCITON, LATICACIDVEN in the last 168 hours.  No results found for this or any previous visit (from the past 240 hour(s)).       Radiology Studies: Ct Renal Stone Study  Result Date: 04/06/2018 CLINICAL DATA:  mid abdominal pain and bilateral lower back pain. Patient denies any penile drainage. Patient states he thought he had a bladder infection and took OTC pyridium EXAM: CT ABDOMEN AND PELVIS WITHOUT CONTRAST TECHNIQUE: Multidetector CT imaging of the abdomen and pelvis was performed following the standard protocol without IV contrast. COMPARISON:  None. FINDINGS: Lower chest: Clear lung bases.  Heart normal size. Hepatobiliary: No focal liver abnormality is seen. No gallstones, gallbladder wall thickening, or biliary dilatation. Pancreas: Unremarkable. No pancreatic ductal dilatation or surrounding inflammatory changes. Spleen: Normal in size without focal abnormality. Adrenals/Urinary Tract: No adrenal masses. Kidneys normal size, orientation and position. No renal masses, stones or hydronephrosis. Ureters are normal in course and in caliber. Bladder wall is thickened. Mild hazy inflammation is seen in the perivesicular fat. Stomach/Bowel: Stomach is within normal limits. Appendix appears normal. No evidence of bowel wall thickening, distention, or inflammatory changes. Vascular/Lymphatic: No significant vascular findings are present. No enlarged abdominal or pelvic lymph nodes. Reproductive: Unremarkable. Other: No abdominal wall hernia or abnormality. No abdominopelvic ascites. Musculoskeletal: No acute or significant osseous findings. IMPRESSION: 1. Cystitis reflected by bladder wall thickening and inflammation in the adjacent fat. Exam otherwise unremarkable. 2. No renal or ureteral stones. Normal unenhanced CT appearance of the kidneys. Electronically Signed   By: Amie Portland M.D.   On: 04/06/2018 19:20        Scheduled Meds: Continuous Infusions: .  sodium chloride 125 mL/hr at 04/07/18 1245  . cefTRIAXone (ROCEPHIN)  IV 1 g (04/07/18 1712)     LOS: 0 days    Time spent: 30 minutes.     Kathlen Mody, MD [facility] Pager [phone]  If 7PM-7AM, please contact night-coverage [email or web address] Password [facility] 04/07/2018, 5:51 PM

## 2018-04-08 DIAGNOSIS — N10 Acute tubulo-interstitial nephritis: Secondary | ICD-10-CM

## 2018-04-08 DIAGNOSIS — N390 Urinary tract infection, site not specified: Secondary | ICD-10-CM

## 2018-04-08 DIAGNOSIS — N179 Acute kidney failure, unspecified: Principal | ICD-10-CM

## 2018-04-08 LAB — BASIC METABOLIC PANEL
ANION GAP: 7 (ref 5–15)
BUN: 18 mg/dL (ref 6–20)
CHLORIDE: 110 mmol/L (ref 101–111)
CO2: 26 mmol/L (ref 22–32)
Calcium: 8.9 mg/dL (ref 8.9–10.3)
Creatinine, Ser: 2.02 mg/dL — ABNORMAL HIGH (ref 0.61–1.24)
GFR calc non Af Amer: 44 mL/min — ABNORMAL LOW (ref >60)
GFR, EST AFRICAN AMERICAN: 51 mL/min — AB (ref >60)
Glucose, Bld: 86 mg/dL (ref 65–99)
Potassium: 4.4 mmol/L (ref 3.5–5.1)
Sodium: 143 mmol/L (ref 135–145)

## 2018-04-08 LAB — CBC
HEMATOCRIT: 35.3 % — AB (ref 39.0–52.0)
HEMOGLOBIN: 12 g/dL — AB (ref 13.0–17.0)
MCH: 32.1 pg (ref 26.0–34.0)
MCHC: 34 g/dL (ref 30.0–36.0)
MCV: 94.4 fL (ref 78.0–100.0)
Platelets: 155 10*3/uL (ref 150–400)
RBC: 3.74 MIL/uL — AB (ref 4.22–5.81)
RDW: 11.5 % (ref 11.5–15.5)
WBC: 8.8 10*3/uL (ref 4.0–10.5)

## 2018-04-08 LAB — URINE CULTURE

## 2018-04-08 MED ORDER — SODIUM CHLORIDE 0.9 % IV SOLN
INTRAVENOUS | Status: DC
  Administered 2018-04-08 – 2018-04-09 (×3): via INTRAVENOUS

## 2018-04-08 MED ORDER — HYDROCORTISONE 1 % EX CREA: TOPICAL_CREAM | Freq: Three times a day (TID) | CUTANEOUS | Status: DC | PRN

## 2018-04-08 NOTE — Progress Notes (Signed)
PROGRESS NOTE    Jeffrey Heath  AVW:[id number] DOB: 12/10/1991 DOA: 04/06/2018 PCP: Patient, No Pcp Per    Brief Narrative: Jeffrey Heath is a 27 y.o. male with no significant past medical history presents to the ER with complaints of low back pain which is been ongoing for last 2 to 3 days.  In the ER UA is consistent with UTI and also has mild proteinuria and labs show creatinine of 3.3 which is increased from patient's baseline in our system measured in December 2015 which was 0.6.  He was admitted for acute renal failure and UTI.    Assessment & Plan:    Principal Problem:   ARF (acute renal failure) (HCC) Active Problems:   Acute lower UTI   Low back pain   ACUTE RENAL FAILURE; Secondary to NSAIDS AND UTI.  Hydrate and his renal parameters are improving but not back to baseline.  CT does not show obstruction.   UTI" Urine cultures show insignificant growth.  On rocephin.  Blood cultures negative.    Low back pain:  Pain control.    Hypokalemia: Replaced.    Rash probably from percocet. Improved with hydrocortisone.   DVT prophylaxis: scd's Code Status: full code.  Family Communication: none at bedside.  Disposition Plan: pending urine cultures.    Consultants:  None  Procedures: none.   Antimicrobials: rocephin.   Subjective: Back pain improved.   Objective: Vitals:   04/07/18 2036 04/08/18 0500 04/08/18 0501 04/08/18 1546  BP: (!) 134/98  (!) 133/101 (!) 131/96  Pulse: 62  64 71  Resp: 16  16   Temp: 98.2 F (36.8 C)  98.1 F (36.7 C) 99.1 F (37.3 C)  TempSrc: Oral  Oral Oral  SpO2: 100%  98% 99%  Weight:  66.6 kg (146 lb 12.8 oz)    Height:        Intake/Output Summary (Last 24 hours) at 04/08/2018 1845 Last data filed at 04/08/2018 1807 Gross per 24 hour  Intake 2315 ml  Output -  Net 2315 ml   Filed Weights   04/06/18 2125 04/07/18 0448 04/08/18 0500  Weight: 63 kg (139 lb) 65.2 kg (143 lb 12.8 oz) 66.6 kg (146 lb 12.8 oz)      Examination: no change in exam from yesterday.   General exam: Appears calm and comfortable  Respiratory system: Clear to auscultation. Respiratory effort normal. Cardiovascular system: S1 & S2 heard, RRR. No JVD, murmurs, rubs, gallops or clicks. No pedal edema. Gastrointestinal system: Abdomen is nondistended, soft and nontender. No organomegaly or masses felt. Normal bowel sounds heard. Central nervous system: Alert and oriented. No focal neurological deficits. Extremities: Symmetric 5 x 5 power. Skin: No rashes, lesions or ulcers Psychiatry:  Mood & affect appropriate.     Data Reviewed: I have personally reviewed following labs and imaging studies  CBC: Recent Labs  Lab 04/06/18 1722 04/07/18 0443 04/08/18 0451  WBC 14.7*  13.9* 12.4* 8.8  NEUTROABS 11.9*  --   --   HGB 14.4  14.7 12.4* 12.0*  HCT 41.3  41.3 37.2* 35.3*  MCV 91.6  91.4 93.5 94.4  PLT 175  179 157 155   Basic Metabolic Panel: Recent Labs  Lab 04/06/18 1722 04/07/18 0443 04/08/18 0451  NA 138 140 143  K 3.5 3.3* 4.4  CL 102 106 110  CO2 GLUCOSE 88 86 86  BUN 22* 20 18  CREATININE 3.35* 3.04* 2.02*  CALCIUM 9.2 8.4* 8.9  GFR: Estimated Creatinine Clearance: 52.3 mL/min (A) (by C-G formula based on SCr of 2.02 mg/dL (H)). Liver Function Tests: Recent Labs  Lab 04/06/18 1722 04/07/18 0443  AST 27 23  ALT 15* 13*  ALKPHOS 51 46  BILITOT 1.0 0.6  PROT 7.7 6.8  ALBUMIN 4.5 4.0   Recent Labs  Lab 04/06/18 1722  LIPASE 27   No results for input(s): AMMONIA in the last 168 hours. Coagulation Profile: No results for input(s): INR, PROTIME in the last 168 hours. Cardiac Enzymes: Recent Labs  Lab 04/06/18 1839  CKTOTAL 159   BNP (last 3 results) No results for input(s): PROBNP in the last 8760 hours. HbA1C: No results for input(s): HGBA1C in the last 72 hours. CBG: No results for input(s): GLUCAP in the last 168 hours. Lipid Profile: No results for input(s):  CHOL, HDL, LDLCALC, TRIG, CHOLHDL, LDLDIRECT in the last 72 hours. Thyroid Function Tests: No results for input(s): TSH, T4TOTAL, FREET4, T3FREE, THYROIDAB in the last 72 hours. Anemia Panel: No results for input(s): VITAMINB12, FOLATE, FERRITIN, TIBC, IRON, RETICCTPCT in the last 72 hours. Sepsis Labs: No results for input(s): PROCALCITON, LATICACIDVEN in the last 168 hours.  Recent Results (from the past 240 hour(s))  Urine culture     Status: Abnormal   Collection Time: 04/06/18  6:02 PM  Result Value Ref Range Status   Specimen Description   Final    URINE, RANDOM Performed at [facility], [address] [place], [place] [address]    Special Requests   Final    NONE Performed at [facility], [address] [place], [place] [address]    Culture (A)  Final    <10,000 COLONIES/mL INSIGNIFICANT GROWTH Performed at [facility], [address], [place], [place] [address]    Report Status 04/08/2018 FINAL  Final  Culture, blood (routine x 2)     Status: None (Preliminary result)   Collection Time: 04/06/18  7:10 PM  Result Value Ref Range Status   Specimen Description   Final    BLOOD LEFT ANTECUBITAL Performed at [facility], [address] [place], [place] [address]    Special Requests   Final    BOTTLES DRAWN AEROBIC AND ANAEROBIC Blood Culture adequate volume Performed at [facility], [address], [place], [place] [address]    Culture   Final    FEW WBC PRESENT, PREDOMINANTLY PMN FEW GRAM POSITIVE COCCI    Report Status PENDING  Incomplete  Culture, blood (routine x 2)     Status: None (Preliminary result)   Collection Time: 04/06/18  7:10 PM  Result Value Ref Range Status   Specimen Description   Final    BLOOD RIGHT ANTECUBITAL Performed at [facility], [address], [place], [place] [address]    Special Requests   Final    BOTTLES DRAWN AEROBIC AND  ANAEROBIC Blood Culture adequate volume Performed at [facility], [address], [place], [place] [address]    Culture   Final    NO GROWTH 1 DAY Performed at [facility], [address] [place], [place] [address]    Report Status PENDING  Incomplete         Radiology Studies: Ct Renal Stone Study  Result Date: 04/06/2018 CLINICAL DATA:  mid abdominal pain and bilateral lower back pain. Patient denies any penile drainage. Patient states he thought he had a bladder infection and  took OTC pyridium EXAM: CT ABDOMEN AND PELVIS WITHOUT CONTRAST TECHNIQUE: Multidetector CT imaging of the abdomen and pelvis was performed following the standard protocol without IV contrast. COMPARISON:  None. FINDINGS: Lower chest: Clear lung bases.  Heart normal size. Hepatobiliary: No focal liver abnormality is seen. No gallstones, gallbladder wall thickening, or biliary dilatation. Pancreas: Unremarkable. No pancreatic ductal dilatation or surrounding inflammatory changes. Spleen: Normal in size without focal abnormality. Adrenals/Urinary Tract: No adrenal masses. Kidneys normal size, orientation and position. No renal masses, stones or hydronephrosis. Ureters are normal in course and in caliber. Bladder wall is thickened. Mild hazy inflammation is seen in the perivesicular fat. Stomach/Bowel: Stomach is within normal limits. Appendix appears normal. No evidence of bowel wall thickening, distention, or inflammatory changes. Vascular/Lymphatic: No significant vascular findings are present. No enlarged abdominal or pelvic lymph nodes. Reproductive: Unremarkable. Other: No abdominal wall hernia or abnormality. No abdominopelvic ascites. Musculoskeletal: No acute or significant osseous findings. IMPRESSION: 1. Cystitis reflected by bladder wall thickening and inflammation in the adjacent fat. Exam otherwise unremarkable. 2. No renal or ureteral stones. Normal unenhanced CT appearance of the  kidneys. Electronically Signed   By: Amie Portland M.D.   On: 04/06/2018 19:20        Scheduled Meds: Continuous Infusions: . sodium chloride 100 mL/hr at 04/08/18 1046  . cefTRIAXone (ROCEPHIN)  IV Stopped (04/08/18 1842)     LOS: 1 day    Time spent: 25 minutes.     Kathlen Mody, MD [facility] Pager [phone]  If 7PM-7AM, please contact night-coverage [email or web address] Password [facility] 04/08/2018, 6:45 PM

## 2018-04-09 LAB — BASIC METABOLIC PANEL
Anion gap: 7 (ref 5–15)
BUN: 11 mg/dL (ref 6–20)
CO2: 24 mmol/L (ref 22–32)
Calcium: 8.7 mg/dL — ABNORMAL LOW (ref 8.9–10.3)
Chloride: 112 mmol/L — ABNORMAL HIGH (ref 101–111)
Creatinine, Ser: 1.31 mg/dL — ABNORMAL HIGH (ref 0.61–1.24)
GFR calc Af Amer: >60 mL/min (ref >60)
Glucose, Bld: 116 mg/dL — ABNORMAL HIGH (ref 65–99)
POTASSIUM: 3.9 mmol/L (ref 3.5–5.1)
Sodium: 143 mmol/L (ref 135–145)

## 2018-04-09 MED ORDER — PANTOPRAZOLE SODIUM 40 MG PO TBEC
40.0000 mg | DELAYED_RELEASE_TABLET | Freq: Every day | ORAL | Status: DC
  Administered 2018-04-09: 40 mg via ORAL
  Filled 2018-04-09: qty 1

## 2018-04-09 MED ORDER — HYDROCORTISONE 1 % EX CREA: TOPICAL_CREAM | Freq: Three times a day (TID) | CUTANEOUS | 0 refills | Status: DC | PRN

## 2018-04-09 MED ORDER — TRAMADOL HCL 50 MG PO TABS
100.0000 mg | ORAL_TABLET | Freq: Four times a day (QID) | ORAL | Status: DC | PRN
  Administered 2018-04-09: 100 mg via ORAL
  Filled 2018-04-09: qty 2

## 2018-04-09 MED ORDER — TRAMADOL HCL 50 MG PO TABS: 50.0000 mg | ORAL_TABLET | Freq: Four times a day (QID) | ORAL | 0 refills | Status: DC | PRN

## 2018-04-09 MED ORDER — PANTOPRAZOLE SODIUM 40 MG PO TBEC: 40.0000 mg | DELAYED_RELEASE_TABLET | Freq: Every day | ORAL | 0 refills | Status: DC

## 2018-04-09 MED ORDER — CEPHALEXIN 500 MG PO CAPS: 500.0000 mg | ORAL_CAPSULE | Freq: Two times a day (BID) | ORAL | 0 refills | Status: AC

## 2018-04-09 NOTE — Progress Notes (Signed)
This CM placed follow up PCP information on the pt AVS. This CM also called multiple clinics for a follow up appointment and was unsuccessful. First available appointment offered was in June. RN made aware. Sandford Craze RN,BSN,NCM [phone]

## 2018-04-12 ENCOUNTER — Encounter (HOSPITAL_COMMUNITY): Payer: Self-pay | Admitting: Family Medicine

## 2018-04-12 ENCOUNTER — Ambulatory Visit (HOSPITAL_COMMUNITY)
Admission: EM | Admit: 2018-04-12 | Discharge: 2018-04-12 | Disposition: A | Discharge status: Home / Self Care | Payer: Self-pay | Attending: Family Medicine | Admitting: Family Medicine

## 2018-04-12 DIAGNOSIS — Z09 Encounter for follow-up examination after completed treatment for conditions other than malignant neoplasm: Secondary | ICD-10-CM

## 2018-04-12 DIAGNOSIS — R109 Unspecified abdominal pain: Secondary | ICD-10-CM

## 2018-04-12 DIAGNOSIS — N179 Acute kidney failure, unspecified: Secondary | ICD-10-CM

## 2018-04-12 DIAGNOSIS — R5383 Other fatigue: Secondary | ICD-10-CM

## 2018-04-12 LAB — POCT I-STAT, CHEM 8
BUN: 6 mg/dL (ref 6–20)
CALCIUM ION: 1.18 mmol/L (ref 1.15–1.40)
Chloride: 100 mmol/L — ABNORMAL LOW (ref 101–111)
Creatinine, Ser: 1.1 mg/dL (ref 0.61–1.24)
Glucose, Bld: 113 mg/dL — ABNORMAL HIGH (ref 65–99)
HEMATOCRIT: 39 % (ref 39.0–52.0)
HEMOGLOBIN: 13.3 g/dL (ref 13.0–17.0)
Potassium: 4.1 mmol/L (ref 3.5–5.1)
SODIUM: 142 mmol/L (ref 135–145)
TCO2: 30 mmol/L (ref 22–32)

## 2018-04-12 LAB — CULTURE, BLOOD (ROUTINE X 2)
Culture: NO GROWTH
Culture: NO GROWTH
Special Requests: ADEQUATE
Special Requests: ADEQUATE

## 2018-04-12 NOTE — ED Provider Notes (Signed)
[facility]    CSN: [id number] Arrival date & time: 04/12/18  1516     History   Chief Complaint Chief Complaint  Patient presents with  . Follow-up    HPI Jeffrey Heath is a 27 y.o. male.   HPI  Patient is here for hospital discharge follow-up.  He was hospitalized 04/06/2018 to 04/09/18.  Presented with flank pain, was found to have a kidney infection, and was in acute kidney failure with a creatinine up to 3.4.  He was hydrated and treated with antibiotics.  He went home with Keflex.  He is here to have his kidney function checked again.  He was told to call for a appointment with his PCP, and to get a creatinine in 2 days.  He did not call for family practice follow-up, but is here to get his kidneys checked. Since going home he feels well.  He is compliant with medication.  Is pushing fluids.  His energy level is coming back but he still somewhat tired.  He has minor flank pain.  When he tries to take tramadol gives him a headache.  I told him to discontinue the tramadol at this time.  He has no nausea vomiting diarrhea.  No sweats chills or fever.  History reviewed. No pertinent past medical history.  Patient Active Problem List   Diagnosis Date Noted  . ARF (acute renal failure) (HCC) 04/06/2018  . Acute lower UTI 04/06/2018  . Low back pain 04/06/2018    Past Surgical History:  Procedure Laterality Date  . head surgery         Home Medications    Prior to Admission medications   Medication Sig Start Date End Date Taking? Authorizing Provider  cephALEXin (KEFLEX) 500 MG capsule Take 1 capsule (500 mg total) by mouth 2 (two) times daily for 5 days. 04/09/18 04/14/18  Kathlen Mody, MD  hydrocortisone cream 1 % Apply topically 3 (three) times daily as needed for itching (use first). 04/09/18   Kathlen Mody, MD  pantoprazole (PROTONIX) 40 MG tablet Take 1 tablet (40 mg total) by mouth daily at 6 (six) AM. 04/09/18   Kathlen Mody, MD  traMADol (ULTRAM) 50 MG  tablet Take 1 tablet (50 mg total) by mouth every 6 (six) hours as needed for moderate pain. 04/09/18   Kathlen Mody, MD    Family History Family History  Problem Relation Age of Onset  . CAD Neg Hx   . Diabetes Mellitus II Neg Hx     Social History Social History   Tobacco Use  . Smoking status: Current Every Day Smoker  . Smokeless tobacco: Never Used  Substance Use Topics  . Alcohol use: Yes    Comment: Occasionally.  . Drug use: Not Currently     Allergies   Patient has no known allergies.   Review of Systems Review of Systems  Constitutional: Positive for fatigue. Negative for appetite change, chills and fever.  HENT: Negative for ear pain and sore throat.   Eyes: Negative for pain and visual disturbance.  Respiratory: Negative for cough and shortness of breath.   Cardiovascular: Negative for chest pain and palpitations.  Gastrointestinal: Negative for abdominal pain, nausea and vomiting.  Genitourinary: Positive for flank pain. Negative for dysuria and hematuria.       Minor  Musculoskeletal: Negative for arthralgias and back pain.  Skin: Negative for color change and rash.  Neurological: Negative for seizures and syncope.  All other systems reviewed and are negative.  Physical Exam Triage Vital Signs ED Triage Vitals  Enc Vitals Group     BP 04/12/18 1545 130/70     Pulse Rate 04/12/18 1545 79     Resp 04/12/18 1545 18     Temp 04/12/18 1545 98.3 F (36.8 C)     Temp src --      SpO2 04/12/18 1545 100 %     Weight --      Height --      Head Circumference --      Peak Flow --      Pain Score 04/12/18 1700 0     Pain Loc --      Pain Edu? --      Excl. in GC? --    No data found.  Updated Vital Signs BP 130/70   Pulse 79   Temp 98.3 F (36.8 C)   Resp 18   SpO2 100%      Physical Exam  Constitutional: He appears well-developed and well-nourished.  HENT:  Head: Normocephalic and atraumatic.  Right Ear: External ear normal.  Left  Ear: External ear normal.  Nose: Nose normal.  Mouth/Throat: Oropharynx is clear and moist.  Eyes: Pupils are equal, round, and reactive to light. Conjunctivae are normal.  Neck: Normal range of motion. Neck supple.  Cardiovascular: Normal rate, regular rhythm and normal heart sounds.  No murmur heard. Pulmonary/Chest: Effort normal and breath sounds normal. No respiratory distress.  Abdominal: Soft. There is no tenderness.  No organomegaly  Musculoskeletal: Normal range of motion. He exhibits no edema.  No CVAT  Neurological: He is alert.  Skin: Skin is warm and dry.  Psychiatric: He has a normal mood and affect.  Nursing note and vitals reviewed.    UC Treatments / Results  Labs (all labs ordered are listed, but only abnormal results are displayed) Labs Reviewed  POCT I-STAT, CHEM 8 - Abnormal; Notable for the following components:      Result Value   Chloride 100 (*)    Glucose, Bld 113 (*)    All other components within normal limits   Results for orders placed or performed during the hospital encounter of 04/12/18  I-STAT, chem 8  Result Value Ref Range   Sodium 142 135 - 145 mmol/L   Potassium 4.1 3.5 - 5.1 mmol/L   Chloride 100 (L) 101 - 111 mmol/L   BUN 6 6 - 20 mg/dL   Creatinine, Ser 3.08 0.61 - 1.24 mg/dL   Glucose, Bld 657 (H) 65 - 99 mg/dL   Calcium, Ion 8.46 9.62 - 1.40 mmol/L   TCO2 30 22 - 32 mmol/L   Hemoglobin 13.3 13.0 - 17.0 g/dL   HCT 95.2 84.1 - 32.4 %     Initial Impression / Assessment and Plan / UC Course  I have reviewed the triage vital signs and the nursing notes.  Pertinent labs & imaging results that were available during my care of the patient were reviewed by me and considered in my medical decision making (see chart for details).     Patient is told that his kidney functions back to normal.  It is important that you keep a follow-up appointment with a primary care doctor.  He needs to complete his Keflex.  He needs to continue to  push fluids. Final Clinical Impressions(s) / UC Diagnoses   Final diagnoses:  Hospital discharge follow-up  Acute kidney injury [facility])     Discharge Instructions     Your kidney  function is normal today Continue to drink lots of water Finish the cephalexin (keflex) Continue the pantoprazole ( protonix) for yourstomach Call Monday for an appointment with your PCP Try not to take the tramadol   ED Prescriptions    None     Controlled Substance Prescriptions [place] Controlled Substance Registry consulted? Not Applicable   Eustace Moore, MD 04/12/18 [phone]

## 2018-04-12 NOTE — ED Triage Notes (Signed)
Pt here for follow up on kidney function. Pt recent hospital admission for kidney infection. sts also that tramadol he was prescribed is giving him a HA.

## 2018-04-12 NOTE — Discharge Instructions (Signed)
Your kidney function is normal today Continue to drink lots of water Finish the cephalexin (keflex) Continue the pantoprazole ( protonix) for yourstomach Call Monday for an appointment with your PCP Try not to take the tramadol

## 2018-04-15 NOTE — Discharge Summary (Signed)
Physician Discharge Summary  Jeffrey Heath NWG:[id number] DOB: 1991-04-14 DOA: 04/06/2018  PCP: Patient, No Pcp Per  Admit date: 04/06/2018 Discharge date: 04/09/2018  Admitted From: Home.  Disposition:  Home.   Recommendations for Outpatient Follow-up:  1. Follow up with PCP in 1-2 weeks 2. Please obtain BMP/CBC in one week     Discharge Condition:stable.  CODE STATUS: full code.  Diet recommendation: regular diet.    Brief/Interim Summary: Jeffrey Heath a 27 y.o.malewithno significant past medical history presents to the ER with complaints of low back pain which is been ongoing for last 2 to 3 days.  In the ER UA is consistent with UTI and also has mild proteinuria and labs show creatinine of 3.3 which is increased from patient's baseline in our system measured in December 2015 which was 0.6.  He was admitted for acute renal failure and UTI.     Discharge Diagnoses:  Principal Problem:   ARF (acute renal failure) (HCC) Active Problems:   Acute lower UTI   Low back pain  ACUTE RENAL FAILURE; Secondary to NSAIDS AND UTI.  Hydrate and his renal parameters are improving, recommend outpatient follow up with PCP in one week to check creatinine.   CT does not show obstruction.   UTI" Urine cultures show insignificant growth.  On rocephin changed to keflex to complete the course.  Blood cultures negative.    Low back pain:  Pain control.    Hypokalemia: Replaced.    Rash probably from percocet. Improved with hydrocortisone.     Discharge Instructions  Discharge Instructions    Diet - low sodium heart healthy   Complete by:  As directed    Discharge instructions   Complete by:  As directed    FOLLOW up with PCP in one week. Please recheck your creatinine in 1 to 2 days.     Allergies as of 04/09/2018   No Known Allergies     Medication List    STOP taking these medications   acetaminophen 500 MG tablet Commonly known as:  TYLENOL    famotidine 20 MG tablet Commonly known as:  PEPCID   hydrOXYzine 25 MG tablet Commonly known as:  ATARAX/VISTARIL   predniSONE 20 MG tablet Commonly known as:  DELTASONE   triamcinolone cream 0.1 % Commonly known as:  KENALOG     TAKE these medications   hydrocortisone cream 1 % Apply topically 3 (three) times daily as needed for itching (use first).   pantoprazole 40 MG tablet Commonly known as:  PROTONIX Take 1 tablet (40 mg total) by mouth daily at 6 (six) AM.   traMADol 50 MG tablet Commonly known as:  ULTRAM Take 1 tablet (50 mg total) by mouth every 6 (six) hours as needed for moderate pain. What changed:  reasons to take this     ASK your doctor about these medications   cephALEXin 500 MG capsule Commonly known as:  KEFLEX Take 1 capsule (500 mg total) by mouth 2 (two) times daily for 5 days. Ask about: Should I take this medication?      Follow-up Information    [facility] Follow up.   Why:  Make follow up appointment to establish primary care MD Contact information: [address] [place] [address] [phone]       [facility] Follow up.   Why:  Make follow up appointment to establish primary care MD Contact information: [address]  [id number] mc [place] [address] [phone]         No Known Allergies  Consultations:  None.    Procedures/Studies: Ct Renal Stone Study  Result Date: 04/06/2018 CLINICAL DATA:  mid abdominal pain and bilateral lower back pain. Patient denies any penile drainage. Patient states he thought he had a bladder infection and took OTC pyridium EXAM: CT ABDOMEN AND PELVIS WITHOUT CONTRAST TECHNIQUE: Multidetector CT imaging of the abdomen and pelvis was performed following the standard protocol without IV contrast. COMPARISON:  None. FINDINGS: Lower chest: Clear lung bases.  Heart normal size. Hepatobiliary: No focal  liver abnormality is seen. No gallstones, gallbladder wall thickening, or biliary dilatation. Pancreas: Unremarkable. No pancreatic ductal dilatation or surrounding inflammatory changes. Spleen: Normal in size without focal abnormality. Adrenals/Urinary Tract: No adrenal masses. Kidneys normal size, orientation and position. No renal masses, stones or hydronephrosis. Ureters are normal in course and in caliber. Bladder wall is thickened. Mild hazy inflammation is seen in the perivesicular fat. Stomach/Bowel: Stomach is within normal limits. Appendix appears normal. No evidence of bowel wall thickening, distention, or inflammatory changes. Vascular/Lymphatic: No significant vascular findings are present. No enlarged abdominal or pelvic lymph nodes. Reproductive: Unremarkable. Other: No abdominal wall hernia or abnormality. No abdominopelvic ascites. Musculoskeletal: No acute or significant osseous findings. IMPRESSION: 1. Cystitis reflected by bladder wall thickening and inflammation in the adjacent fat. Exam otherwise unremarkable. 2. No renal or ureteral stones. Normal unenhanced CT appearance of the kidneys. Electronically Signed   By: Amie Portland M.D.   On: 04/06/2018 19:20       Subjective: No new complaints   Discharge Exam: Vitals:   04/08/18 1938 04/09/18 0500  BP: (!) 136/97 (!) 132/91  Pulse: 74 (!) 59  Resp: 20 18  Temp: 97.7 F (36.5 C) 97.9 F (36.6 C)  SpO2: 100% 100%   Vitals:   04/08/18 1546 04/08/18 1938 04/09/18 0500 04/09/18 0501  BP: (!) 131/96 (!) 136/97 (!) 132/91   Pulse: 71 74 (!) 59   Resp:  20 18   Temp: 99.1 F (37.3 C) 97.7 F (36.5 C) 97.9 F (36.6 C)   TempSrc: Oral Oral Oral   SpO2: 99% 100% 100%   Weight:    66.1 kg (145 lb 11.2 oz)  Height:        General: Pt is alert, awake, not in acute distress Cardiovascular: RRR, S1/S2 +, no rubs, no gallops Respiratory: CTA bilaterally, no wheezing, no rhonchi Abdominal: Soft, NT, ND, bowel sounds  + Extremities: no edema, no cyanosis    The results of significant diagnostics from this hospitalization (including imaging, microbiology, ancillary and laboratory) are listed below for reference.     Microbiology: Recent Results (from the past 240 hour(s))  Urine culture     Status: Abnormal   Collection Time: 04/06/18  6:02 PM  Result Value Ref Range Status   Specimen Description   Final    URINE, RANDOM Performed at [facility], [address], [place], [place] [address]    Special Requests   Final    NONE Performed at [facility], [address], [place], [place] [address]    Culture (A)  Final    <10,000 COLONIES/mL INSIGNIFICANT GROWTH Performed at [facility], [address] [place] [address] [place], [place] [address]    Report Status 04/08/2018 FINAL  Final  Culture, blood (routine x 2)     Status: None   Collection Time: 04/06/18  7:10 PM  Result Value Ref Range Status   Specimen Description   Final    BLOOD LEFT ANTECUBITAL Performed at [facility], [address], [place], [place] [address]    Special Requests   Final    BOTTLES DRAWN AEROBIC AND ANAEROBIC Blood Culture adequate volume Performed at [facility], [address] [place], [place] [address]    Culture   Final    NO GROWTH 5 DAYS Performed at [facility], [address] [place], [place] [address]    Report Status 04/12/2018 FINAL  Final  Culture, blood (routine x 2)     Status: None   Collection Time: 04/06/18  7:10 PM  Result Value Ref Range Status   Specimen Description   Final    BLOOD RIGHT ANTECUBITAL Performed at [facility], [address], [place], [place] [address]    Special Requests   Final    BOTTLES DRAWN AEROBIC AND ANAEROBIC Blood Culture adequate volume Performed at [facility], [address], [place], [place] [address]    Culture   Final    NO GROWTH 5  DAYS Performed at [facility], [address], [place], [place] [address]    Report Status 04/12/2018 FINAL  Final     Labs: BNP (last 3 results) No results for input(s): BNP in the last 8760 hours. Basic Metabolic Panel: Recent Labs  Lab 04/08/18 0451 04/09/18 0451 04/12/18 1547  NA 143 143 142  K 4.4 3.9 4.1  CL 110 112* 100*  CO2 26 24  --   GLUCOSE 86 116* 113*  BUN CREATININE 2.02* 1.31* 1.10  CALCIUM 8.9 8.7*  --    Liver Function Tests: No results for input(s): AST, ALT, ALKPHOS, BILITOT, PROT, ALBUMIN in the last 168 hours. No results for input(s): LIPASE, AMYLASE in the last 168 hours. No results for input(s): AMMONIA in the last 168 hours. CBC: Recent Labs  Lab 04/08/18 0451 04/12/18 1547  WBC 8.8  --   HGB 12.0* 13.3  HCT 35.3* 39.0  MCV 94.4  --   PLT 155  --    Cardiac Enzymes: No results for input(s): CKTOTAL, CKMB, CKMBINDEX, TROPONINI in the last 168 hours. BNP: Invalid input(s): POCBNP CBG: No results for input(s): GLUCAP in the last 168 hours. D-Dimer No results for input(s): DDIMER in the last 72 hours. Hgb A1c No results for input(s): HGBA1C in the last 72 hours. Lipid Profile No results for input(s): CHOL, HDL, LDLCALC, TRIG, CHOLHDL, LDLDIRECT in the last 72 hours. Thyroid function studies No results for input(s): TSH, T4TOTAL, T3FREE, THYROIDAB in the last 72 hours.  Invalid input(s): FREET3 Anemia work up No results for input(s): VITAMINB12, FOLATE, FERRITIN, TIBC, IRON, RETICCTPCT in the last 72 hours. Urinalysis    Component Value Date/Time   COLORURINE AMBER (A) 04/06/2018 1731   APPEARANCEUR HAZY (A) 04/06/2018 1731   LABSPEC 1.011 04/06/2018 1731   PHURINE 6.0 04/06/2018 1731   GLUCOSEU NEGATIVE 04/06/2018 1731   HGBUR LARGE (A) 04/06/2018 1731   BILIRUBINUR NEGATIVE 04/06/2018 1731   KETONESUR NEGATIVE 04/06/2018 1731   PROTEINUR 100 (A) 04/06/2018 1731   UROBILINOGEN 1.0 11/16/2014 1610   NITRITE  POSITIVE (A) 04/06/2018 1731   LEUKOCYTESUR LARGE (A) 04/06/2018 1731   Sepsis Labs Invalid input(s): PROCALCITONIN,  WBC,  LACTICIDVEN Microbiology Recent Results (from the past 240 hour(s))  Urine culture     Status: Abnormal  Collection Time: 04/06/18  6:02 PM  Result Value Ref Range Status   Specimen Description   Final    URINE, RANDOM Performed at [facility], [address], [place], [place] [address]    Special Requests   Final    NONE Performed at [facility], [address], [place], [place] [address]    Culture (A)  Final    <10,000 COLONIES/mL INSIGNIFICANT GROWTH Performed at [facility], [address] [place], [place] [address]    Report Status 04/08/2018 FINAL  Final  Culture, blood (routine x 2)     Status: None   Collection Time: 04/06/18  7:10 PM  Result Value Ref Range Status   Specimen Description   Final    BLOOD LEFT ANTECUBITAL Performed at [facility], [address] [place], [place] [address]    Special Requests   Final    BOTTLES DRAWN AEROBIC AND ANAEROBIC Blood Culture adequate volume Performed at [facility], [address] [place], [place] [address]    Culture   Final    NO GROWTH 5 DAYS Performed at [facility], [address] [place], [place] [address]    Report Status 04/12/2018 FINAL  Final  Culture, blood (routine x 2)     Status: None   Collection Time: 04/06/18  7:10 PM  Result Value Ref Range Status   Specimen Description   Final    BLOOD RIGHT ANTECUBITAL Performed at [facility], [address] [place], [place] [address]    Special Requests   Final    BOTTLES DRAWN AEROBIC AND ANAEROBIC Blood Culture adequate volume Performed at [facility], [address], [place], [place] [address]    Culture   Final    NO GROWTH 5 DAYS Performed at [facility], [address], [place],  [place] [address]    Report Status 04/12/2018 FINAL  Final     Time coordinating discharge: 35  minutes  SIGNED:   Kathlen Mody, MD  [facility] 04/15/2018, 12:45 AM Pager   If 7PM-7AM, please contact night-coverage [email or web address] Password [email or web address]

## 2018-04-25 ENCOUNTER — Other Ambulatory Visit: Payer: Self-pay

## 2018-04-25 ENCOUNTER — Ambulatory Visit: Payer: Self-pay | Attending: Family Medicine | Admitting: Physician Assistant

## 2018-04-25 VITALS — BP 108/66 | HR 86 | Temp 98.2°F | Resp 18 | Ht 69.0 in | Wt 138.0 lb

## 2018-04-25 DIAGNOSIS — N3001 Acute cystitis with hematuria: Secondary | ICD-10-CM

## 2018-04-25 DIAGNOSIS — E876 Hypokalemia: Secondary | ICD-10-CM | POA: Insufficient documentation

## 2018-04-25 DIAGNOSIS — N179 Acute kidney failure, unspecified: Secondary | ICD-10-CM | POA: Insufficient documentation

## 2018-04-25 DIAGNOSIS — Z7952 Long term (current) use of systemic steroids: Secondary | ICD-10-CM | POA: Insufficient documentation

## 2018-04-25 DIAGNOSIS — Z79899 Other long term (current) drug therapy: Secondary | ICD-10-CM | POA: Insufficient documentation

## 2018-04-25 DIAGNOSIS — Z833 Family history of diabetes mellitus: Secondary | ICD-10-CM | POA: Insufficient documentation

## 2018-04-25 DIAGNOSIS — R319 Hematuria, unspecified: Secondary | ICD-10-CM | POA: Insufficient documentation

## 2018-04-25 DIAGNOSIS — Z8249 Family history of ischemic heart disease and other diseases of the circulatory system: Secondary | ICD-10-CM | POA: Insufficient documentation

## 2018-04-25 DIAGNOSIS — Z09 Encounter for follow-up examination after completed treatment for conditions other than malignant neoplasm: Secondary | ICD-10-CM | POA: Insufficient documentation

## 2018-04-25 DIAGNOSIS — R3129 Other microscopic hematuria: Secondary | ICD-10-CM

## 2018-04-25 DIAGNOSIS — Z9889 Other specified postprocedural states: Secondary | ICD-10-CM | POA: Insufficient documentation

## 2018-04-25 DIAGNOSIS — N390 Urinary tract infection, site not specified: Secondary | ICD-10-CM | POA: Insufficient documentation

## 2018-04-25 LAB — POCT URINALYSIS DIPSTICK
Bilirubin, UA: NEGATIVE
Glucose, UA: NEGATIVE
Leukocytes, UA: NEGATIVE
NITRITE UA: NEGATIVE
PROTEIN UA: 30
SPEC GRAV UA: 1.025 (ref 1.010–1.025)
Urobilinogen, UA: 1 E.U./dL
pH, UA: 6 (ref 5.0–8.0)

## 2018-04-25 NOTE — Addendum Note (Signed)
Addended by: Roger Kill on: 04/25/2018 03:50 PM   Modules accepted: Orders

## 2018-04-25 NOTE — Progress Notes (Signed)
Jeffrey Heath  ZOX:[id number]  WJX:[id number]  DOB - 03-08-1991  Chief Complaint  Patient presents with  . Hospitalization Follow-up       Subjective:   Jeffrey Heath is a 27 y.o. male here today for establishment of care. He was hospitalized from 04/06/18-4/30-19 for abd pain, back pain and dysuria. No significant PHMx though does states he gets frequent HAs, take Tylenol and NSAIDS prn.  He was found to have a creatinine of 3.3.  His urinalysis was abnormal.  His white blood cell count was elevated at 14,000.  CT scan of the renals showed cystitis only.  He was hypokalemic.  He was admitted to the internal medicine team with acute kidney injury and an acute urinary tract infection.  He was hydrated.  He was started on IV antibiotics which was later switched to oral.  His blood cultures were negative.  The only other thing is that his blood pressure was frequently elevated while in-house.  Systolic pressures in the 130s and diastolics in the 90s.  At discharge his creatinine was 1.3.  A few days later they had him come back for just labs only and his creatinine was 1.1 on 04/12/2018.  Since discharge no acute complaints taking no medications.  Back to work.  Smokes daily but not many cigarettes.  Does not see any blood in his urine.  No difficulty urinating.  No headaches currently.  Not taking any over-the-counter medications.    ROS: GEN: denies fever or chills, denies change in weight Skin: denies lesions or rashes HEENT: denies headache, earache, epistaxis, sore throat, or neck pain LUNGS: denies SHOB, dyspnea, PND, orthopnea CV: denies CP or palpitations ABD: denies abd pain, N or V EXT: denies muscle spasms or swelling; no pain in lower ext, no weakness NEURO: denies numbness or tingling, denies sz, stroke or TIA  ALLERGIES: No Known Allergies  PAST MEDICAL HISTORY: No past medical history on file.  PAST SURGICAL HISTORY: Past Surgical History:  Procedure Laterality Date    . head surgery      MEDICATIONS AT HOME: Prior to Admission medications   Medication Sig Start Date End Date Taking? Authorizing Provider  hydrocortisone cream 1 % Apply topically 3 (three) times daily as needed for itching (use first). Patient not taking: Reported on 04/25/2018 04/09/18   Kathlen Mody, MD  pantoprazole (PROTONIX) 40 MG tablet Take 1 tablet (40 mg total) by mouth daily at 6 (six) AM. Patient not taking: Reported on 04/25/2018 04/09/18   Kathlen Mody, MD  traMADol (ULTRAM) 50 MG tablet Take 1 tablet (50 mg total) by mouth every 6 (six) hours as needed for moderate pain. Patient not taking: Reported on 04/25/2018 04/09/18   Kathlen Mody, MD    Family History  Problem Relation Age of Onset  . CAD Neg Hx   . Diabetes Mellitus II Neg Hx    Social-unmarried, 2 children, smokes, works for [employer]  Objective:   Vitals:   04/25/18 1419  BP: 108/66  Pulse: 86  Resp: 18  Temp: 98.2 F (36.8 C)  TempSrc: Oral  SpO2: 97%  Weight: 138 lb (62.6 kg)  Height:  (1.753 m)    Exam-benign General appearance : Awake, alert, not in any distress. Speech Clear. Not toxic looking HEENT: Atraumatic and Normocephalic, pupils equally reactive to light and accomodation Neck: supple, no JVD. No cervical lymphadenopathy.  Chest:Good air entry bilaterally, no added sounds  CVS: S1 S2 regular, no murmurs.  Abdomen: Bowel sounds  present, Non tender and not distended with no guarding, rigidity or rebound. Extremities: B/L Lower Ext shows no edema, both legs are warm to touch Neurology: Awake alert, and oriented X 3, CN II-XII intact, Non focal Skin:No Rash Wounds:N/A   Assessment & Plan  1. Acute Kidney Injury-reolsved  -check BMP today  -avoid NSAIDS 2. Hematuria-persists  -Urology referral 3. Acute UTI  -antibiotics completed  -dipstick today/TOC   Return in about 1 month (around 05/23/2018).  The patient was given clear instructions to go to ER or return  to medical center if symptoms don't improve, worsen or new problems develop. The patient verbalized understanding. The patient was told to call to get lab results if they haven't heard anything in the next week.   Total time spent with patient was 32 min. Greater than 50 % of this visit was spent face to face counseling and coordinating care regarding risk factor modification, compliance importance and encouragement, education related to hospitalization review.  This note has been created with Education officer, environmental. Any transcriptional errors are unintentional.    Scot Jun, PA-C [facility] and [facility] [place], [place] [phone]   04/25/2018, 2:35 PM

## 2018-04-26 LAB — CBC WITH DIFFERENTIAL/PLATELET
BASOS: 1 %
Basophils Absolute: 0 10*3/uL (ref 0.0–0.2)
EOS (ABSOLUTE): 0.1 10*3/uL (ref 0.0–0.4)
EOS: 1 %
HEMOGLOBIN: 12.5 g/dL — AB (ref 13.0–17.7)
Hematocrit: 36.8 % — ABNORMAL LOW (ref 37.5–51.0)
IMMATURE GRANS (ABS): 0 10*3/uL (ref 0.0–0.1)
Immature Granulocytes: 0 %
LYMPHS ABS: 2.1 10*3/uL (ref 0.7–3.1)
LYMPHS: 47 %
MCH: 31.2 pg (ref 26.6–33.0)
MCHC: 34 g/dL (ref 31.5–35.7)
MCV: 92 fL (ref 79–97)
MONOCYTES: 8 %
Monocytes Absolute: 0.4 10*3/uL (ref 0.1–0.9)
NEUTROS ABS: 1.9 10*3/uL (ref 1.4–7.0)
Neutrophils: 43 %
Platelets: 260 10*3/uL (ref 150–379)
RBC: 4.01 x10E6/uL — ABNORMAL LOW (ref 4.14–5.80)
RDW: 12.4 % (ref 12.3–15.4)
WBC: 4.4 10*3/uL (ref 3.4–10.8)

## 2018-04-26 LAB — BASIC METABOLIC PANEL
BUN / CREAT RATIO: 11 (ref 9–20)
BUN: 11 mg/dL (ref 6–20)
CO2: 27 mmol/L (ref 20–29)
CREATININE: 0.97 mg/dL (ref 0.76–1.27)
Calcium: 9.7 mg/dL (ref 8.7–10.2)
Chloride: 108 mmol/L — ABNORMAL HIGH (ref 96–106)
GFR calc Af Amer: 125 mL/min/{1.73_m2} (ref >59)
GFR calc non Af Amer: 108 mL/min/{1.73_m2} (ref >59)
GLUCOSE: 70 mg/dL (ref 65–99)
Potassium: 4.2 mmol/L (ref 3.5–5.2)
SODIUM: 148 mmol/L — AB (ref 134–144)

## 2018-05-31 ENCOUNTER — Ambulatory Visit: Payer: Self-pay | Attending: Family Medicine

## 2018-07-05 ENCOUNTER — Ambulatory Visit: Payer: Self-pay | Attending: Nurse Practitioner | Admitting: Nurse Practitioner

## 2018-07-05 ENCOUNTER — Encounter: Payer: Self-pay | Admitting: Nurse Practitioner

## 2018-07-05 VITALS — BP 118/77 | HR 74 | Temp 98.6°F | Ht 69.0 in | Wt 136.6 lb

## 2018-07-05 DIAGNOSIS — E870 Hyperosmolality and hypernatremia: Secondary | ICD-10-CM

## 2018-07-05 DIAGNOSIS — R251 Tremor, unspecified: Secondary | ICD-10-CM

## 2018-07-05 DIAGNOSIS — R634 Abnormal weight loss: Secondary | ICD-10-CM

## 2018-07-05 NOTE — Progress Notes (Signed)
Assessment & Plan:  Jeffrey Heath was seen today for establish care.  Diagnoses and all orders for this visit:  Tremor of both hands -     TSH  Hypernatremia -     CMP14+EGFR  Unintentional weight loss -     TSH    Patient has been counseled on age-appropriate routine health concerns for screening and prevention. These are reviewed and up-to-date. Referrals have been placed accordingly. Immunizations are up-to-date or declined.    Subjective:   Chief Complaint  Patient presents with  . Establish Care    Patient stated sometimes his hand trembles alot and he want to gain weight, and increase his apeptite.    HPI Jeffrey Heath 27 y.o. male presents to office today to establish care.   Tremor  He complains of tremor. Tremor primarily involves the bilateral hand.  Onset of symptoms was "a long time ago".  Symptoms are currently of mild, off and on, brief and intermittent severity. Tremor exacerbated by activity. Tremor is alleviated by nothing: goes away on its own. Symptoms occur intermittently and last minutes.He denies voice change, rigidity, postural changes, difficulty in initiating movement, difficulty with walking, difficult with posture, difficulty with swallowing and balance problems.    Review of Systems  Constitutional: Negative for fever, malaise/fatigue and weight loss.       Difficulty gaining weight; recommended protein shakes, peanut butter and milk  HENT: Negative.  Negative for nosebleeds.   Eyes: Negative.  Negative for blurred vision, double vision and photophobia.  Respiratory: Negative.  Negative for cough and shortness of breath.   Cardiovascular: Negative.  Negative for chest pain, palpitations and leg swelling.  Gastrointestinal: Negative.  Negative for heartburn, nausea and vomiting.  Musculoskeletal: Negative.  Negative for myalgias.  Neurological: Positive for tremors. Negative for dizziness, tingling, focal weakness, seizures, weakness and headaches.    Psychiatric/Behavioral: Negative.  Negative for suicidal ideas.    History reviewed. No pertinent past medical history.  Past Surgical History:  Procedure Laterality Date  . head surgery      Family History  Problem Relation Age of Onset  . CAD Neg Hx   . Diabetes Mellitus II Neg Hx     Social History Reviewed with no changes to be made today.   Outpatient Medications Prior to Visit  Medication Sig Dispense Refill  . acetaminophen (TYLENOL) 500 MG tablet Take 500 mg by mouth every 6 (six) hours as needed.    . hydrocortisone cream 1 % Apply topically 3 (three) times daily as needed for itching (use first). (Patient not taking: Reported on 04/25/2018) 30 g 0  . pantoprazole (PROTONIX) 40 MG tablet Take 1 tablet (40 mg total) by mouth daily at 6 (six) AM. (Patient not taking: Reported on 04/25/2018) 7 tablet 0  . traMADol (ULTRAM) 50 MG tablet Take 1 tablet (50 mg total) by mouth every 6 (six) hours as needed for moderate pain. (Patient not taking: Reported on 04/25/2018) 8 tablet 0   No facility-administered medications prior to visit.     No Known Allergies     Objective:    BP 118/77 (BP Location: Right Arm, Patient Position: Sitting, Cuff Size: Normal)   Pulse 74   Temp 98.6 F (37 C) (Oral)   Ht 5' 9"  (1.753 m)   Wt 136 lb 9.6 oz (62 kg)   SpO2 100%   BMI 20.17 kg/m  Wt Readings from Last 3 Encounters:  07/05/18 136 lb 9.6 oz (62 kg)  04/25/18 138  lb (62.6 kg)  04/09/18 145 lb 11.2 oz (66.1 kg)    Physical Exam  Constitutional: He is oriented to person, place, and time. He appears well-developed and well-nourished. He is cooperative.  HENT:  Head: Normocephalic and atraumatic.  Eyes: EOM are normal.  Neck: Normal range of motion.  Cardiovascular: Normal rate, regular rhythm and normal heart sounds. Exam reveals no gallop and no friction rub.  No murmur heard. Pulmonary/Chest: Effort normal and breath sounds normal. No tachypnea. No respiratory distress. He  has no decreased breath sounds. He has no wheezes. He has no rhonchi. He has no rales. He exhibits no tenderness.  Abdominal: Soft. Bowel sounds are normal.  Musculoskeletal: Normal range of motion. He exhibits no edema.  Neurological: He is alert and oriented to person, place, and time. He displays no atrophy and no tremor. No cranial nerve deficit or sensory deficit. He exhibits normal muscle tone. He displays no seizure activity. Coordination and gait normal.  Skin: Skin is warm and dry.  Psychiatric: He has a normal mood and affect. His behavior is normal. Judgment and thought content normal.  Nursing note and vitals reviewed.      Patient has been counseled extensively about nutrition and exercise as well as the importance of adherence with medications and regular follow-up. The patient was given clear instructions to go to ER or return to medical center if symptoms don't improve, worsen or new problems develop. The patient verbalized understanding.   Follow-up: No follow-ups on file.   Gildardo Pounds, FNP-BC [facility] and [facility], [place]   07/05/2018, 4:37 PM

## 2018-07-05 NOTE — Patient Instructions (Signed)
Tremor A tremor is trembling or shaking that you cannot control. Most tremors affect the hands or arms. Tremors can also affect the head, vocal cords, face, and other parts of the body. There are many types of tremors. Common types include:  Essential tremor. These usually occur in people over the age of 40. It may run in families and can happen in otherwise healthy people.  Resting tremor. These occur when the muscles are at rest, such as when your hands are resting in your lap. People with Parkinson disease often have resting tremors.  Postural tremor. These occur when you try to hold a pose, such as keeping your hands outstretched.  Kinetic tremor. These occur during purposeful movement, such as trying to touch a finger to your nose.  Task-specific tremor. These may occur when you perform tasks such as handwriting, speaking, or standing.  Psychogenic tremor. These dramatically lessen or disappear when you are distracted. They can happen in people of all ages.  Some types of tremors have no known cause. Tremors can also be a symptom of nervous system problems (neurological disorders) that may occur with aging. Some tremors go away with treatment while others do not. Follow these instructions at home: Watch your tremor for any changes. The following actions may help to lessen any discomfort you are feeling:  Take medicines only as directed by your health care provider.  Limit alcohol intake to no more than 1 drink per day for nonpregnant women and 2 drinks per day for men. One drink equals 12 oz of beer, 5 oz of wine, or 1 oz of hard liquor.  Do not use any tobacco products, including cigarettes, chewing tobacco, or electronic cigarettes. If you need help quitting, ask your health care provider.  Avoid extreme heat or cold.  Limit the amount of caffeine you consumeas directed by your health care provider.  Try to get 8 hours of sleep each night.  Find ways to manage your stress,  such as meditation or yoga.  Keep all follow-up visits as directed by your health care provider. This is important.  Contact a health care provider if:  You start having a tremor after starting a new medicine.  You have tremor with other symptoms such as: ? Numbness. ? Tingling. ? Pain. ? Weakness.  Your tremor gets worse.  Your tremor interferes with your day-to-day life. This information is not intended to replace advice given to you by your health care provider. Make sure you discuss any questions you have with your health care provider. Document Released: 11/17/2002 Document Revised: 07/30/2016 Document Reviewed: 05/25/2014 Elsevier Interactive Patient Education  2018 [employer]  

## 2018-07-06 LAB — CMP14+EGFR
A/G RATIO: 1.9 (ref 1.2–2.2)
ALBUMIN: 5 g/dL (ref 3.5–5.5)
ALT: 14 IU/L (ref 0–44)
AST: 22 IU/L (ref 0–40)
Alkaline Phosphatase: 53 IU/L (ref 39–117)
BUN / CREAT RATIO: 9 (ref 9–20)
BUN: 9 mg/dL (ref 6–20)
Bilirubin Total: 0.6 mg/dL (ref 0.0–1.2)
CO2: 24 mmol/L (ref 20–29)
Calcium: 9.6 mg/dL (ref 8.7–10.2)
Chloride: 100 mmol/L (ref 96–106)
Creatinine, Ser: 1 mg/dL (ref 0.76–1.27)
GFR calc non Af Amer: 104 mL/min/{1.73_m2} (ref >59)
GFR, EST AFRICAN AMERICAN: 120 mL/min/{1.73_m2} (ref >59)
GLUCOSE: 86 mg/dL (ref 65–99)
Globulin, Total: 2.6 g/dL (ref 1.5–4.5)
Potassium: 4 mmol/L (ref 3.5–5.2)
Sodium: 140 mmol/L (ref 134–144)
TOTAL PROTEIN: 7.6 g/dL (ref 6.0–8.5)

## 2018-07-09 LAB — TSH: TSH: 0.669 u[IU]/mL (ref 0.450–4.500)

## 2018-07-09 LAB — SPECIMEN STATUS REPORT

## 2018-10-08 IMAGING — CT CT RENAL STONE PROTOCOL
2 of 4 series · 16 of 46 positions shown, 18 images · non-contrast
Comparison: None.

CLINICAL DATA: mid abdominal pain and bilateral lower back pain.
a bladder infection and took OTC pyridium

EXAM:
CT ABDOMEN AND PELVIS WITHOUT CONTRAST
TECHNIQUE: Multidetector CT imaging of the abdomen and pelvis was performed
following the standard protocol without IV contrast.

[Series 2: axial st · axial · 0.63mm/px · z∈[+1305,+1710]mm · 13 of 91 slices shown, 15 images]
[im 5/91  soft-tissue]
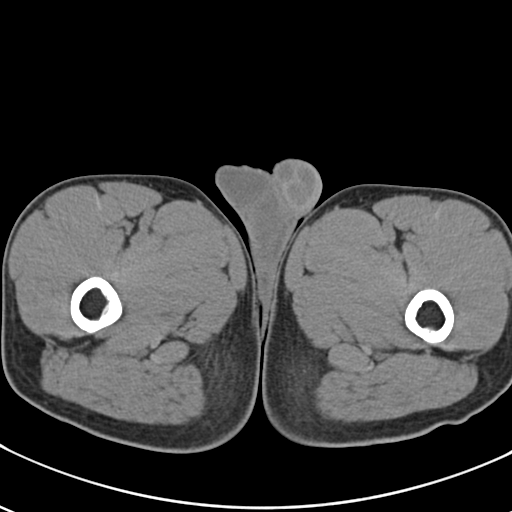
[im 5/91  bone]
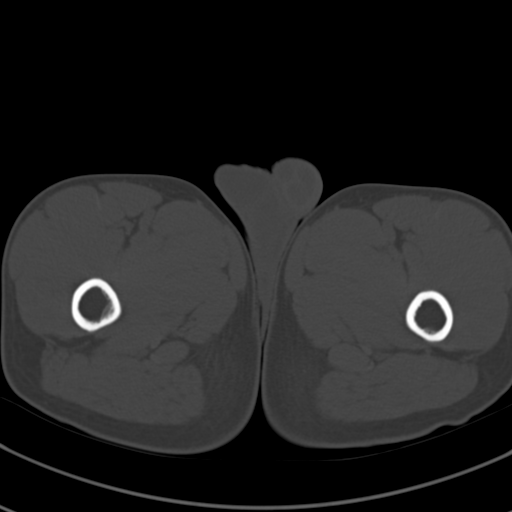
[im 13/91  soft-tissue]
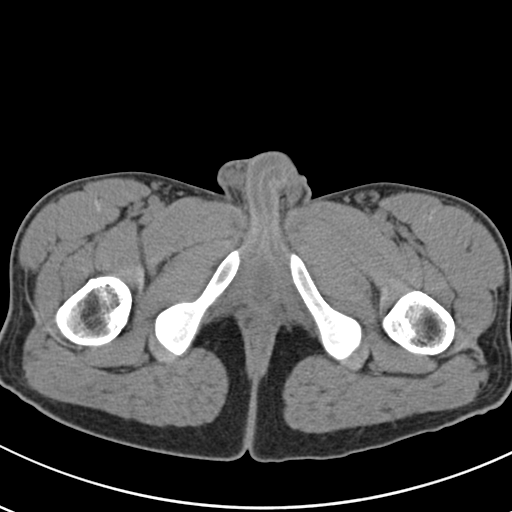
[im 21/91  soft-tissue]
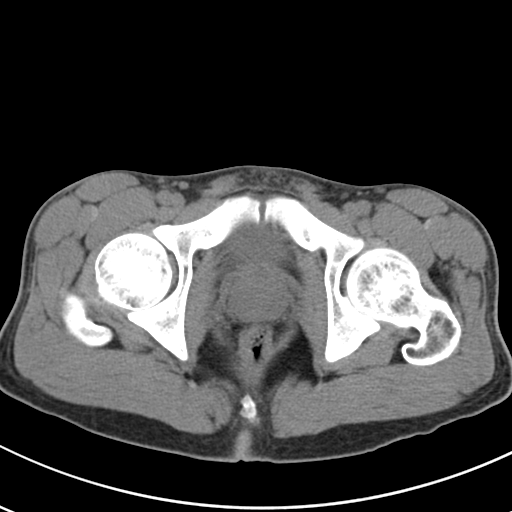
[im 25/91  soft-tissue]
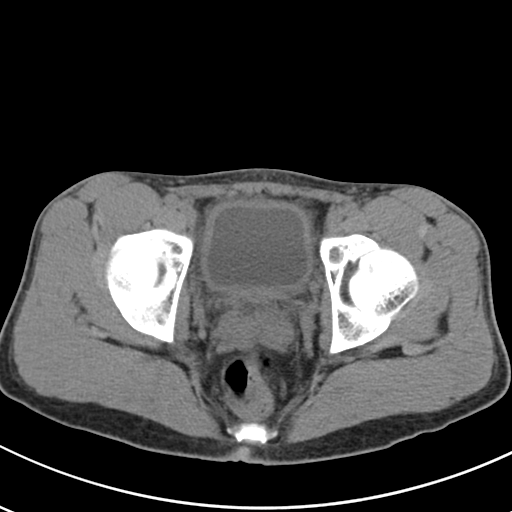
[im 33/91  soft-tissue]
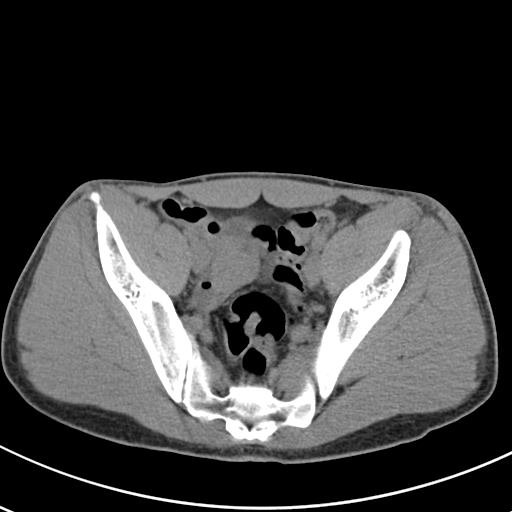
[im 37/91  soft-tissue]
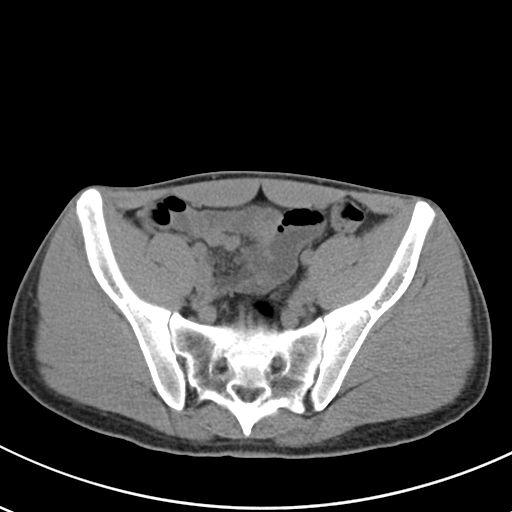
[im 46/91  soft-tissue]
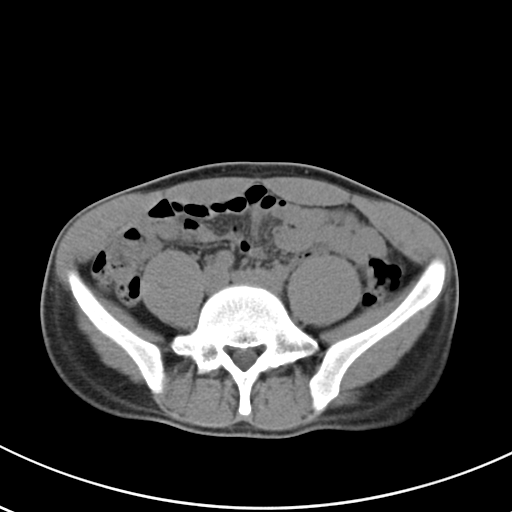
[im 54/91  soft-tissue]
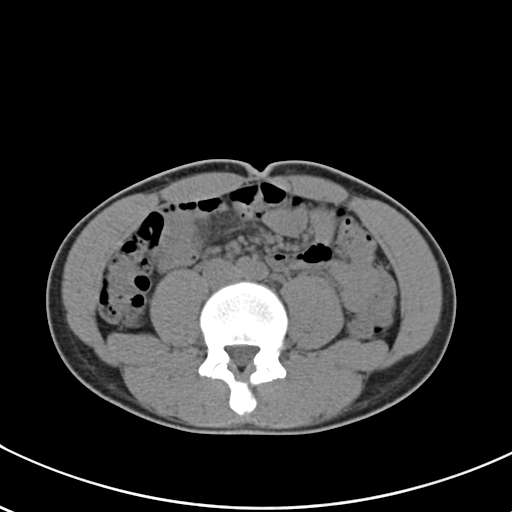
[im 58/91  soft-tissue]
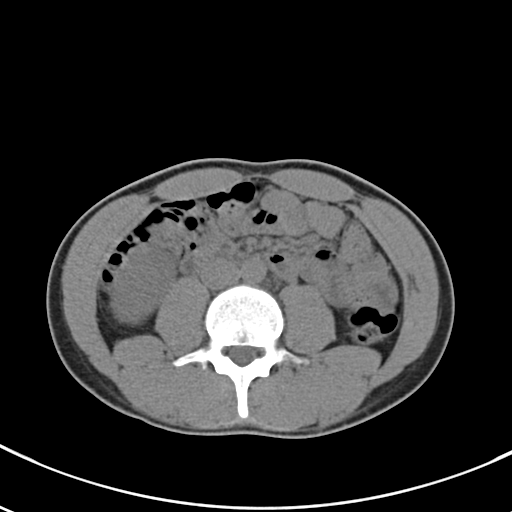
[im 58/91  bone]
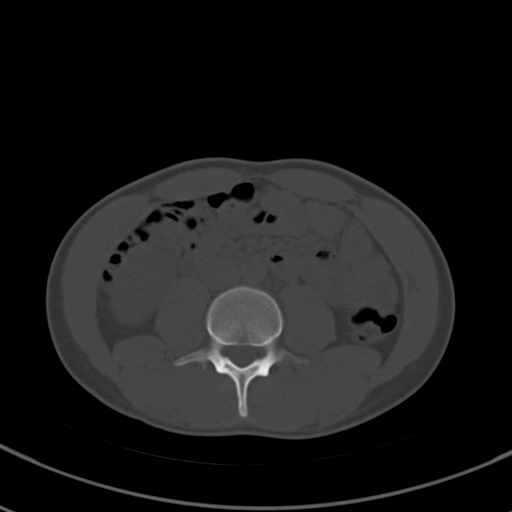
[im 66/91  soft-tissue]
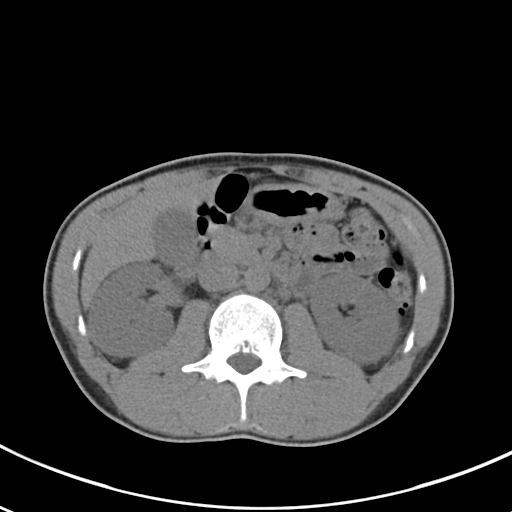
[im 70/91  soft-tissue]
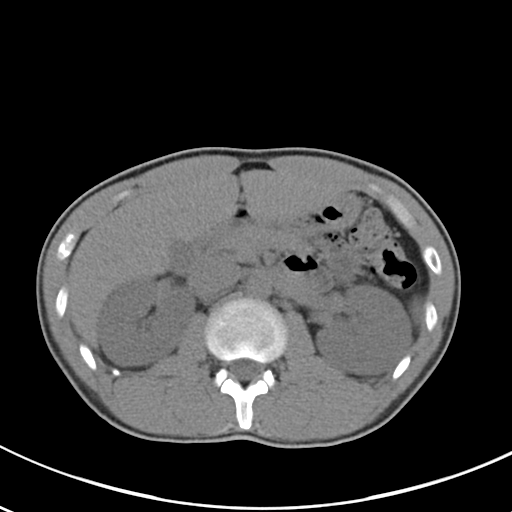
[im 78/91  soft-tissue]
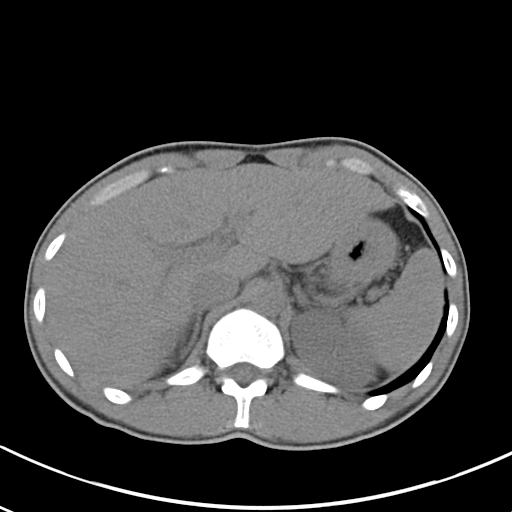
[im 86/91  soft-tissue]
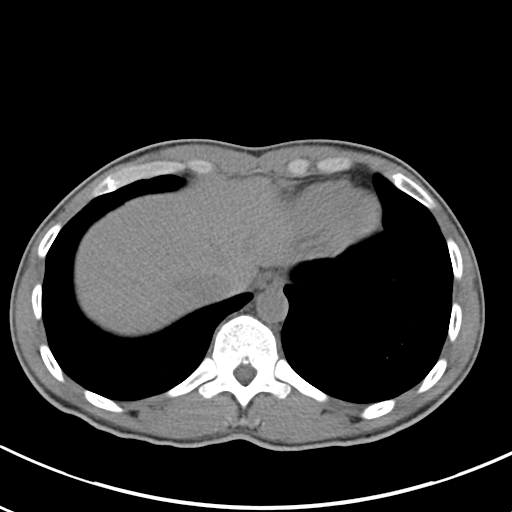

[Series 4: coronal · coronal · 0.76mm/px · 3 of 129 slices shown]
[im 43/129  soft-tissue]
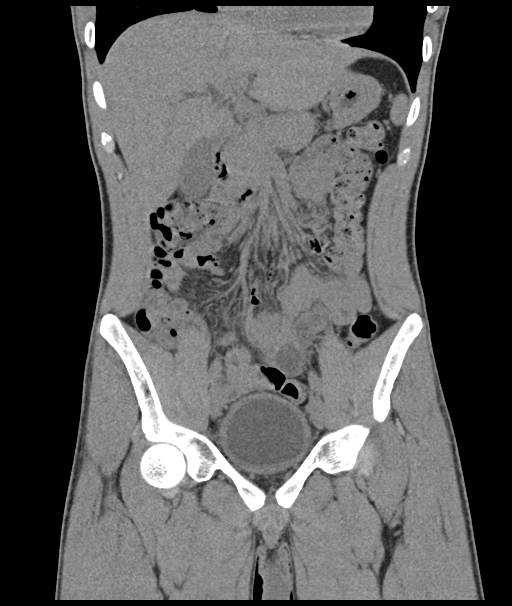
[im 57/129  soft-tissue]
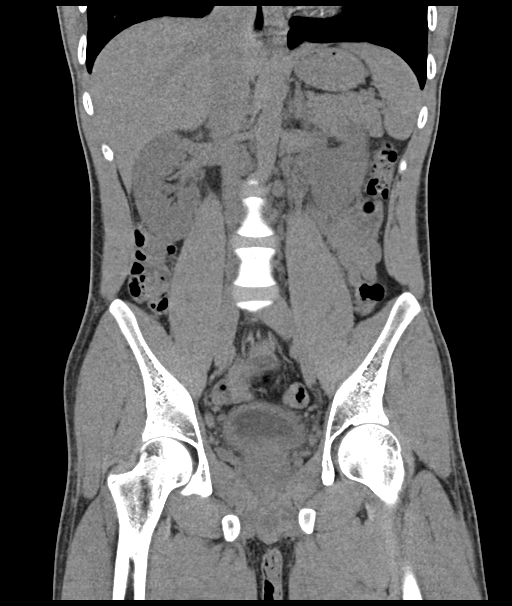
[im 72/129  soft-tissue]
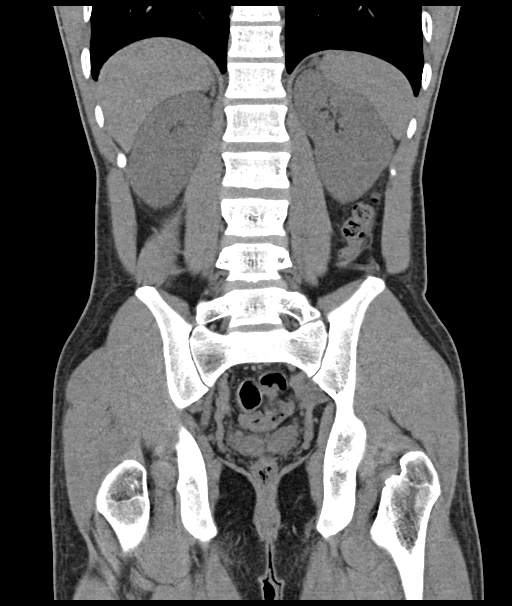

[16 of 46 positions shown; findings below may reference images not displayed]

FINDINGS: Lower chest: Clear lung bases.  Heart normal size.

Hepatobiliary: No focal liver abnormality is seen. No gallstones,
gallbladder wall thickening, or biliary dilatation.

Pancreas: Unremarkable. No pancreatic ductal dilatation or
surrounding inflammatory changes.

Spleen: Normal in size without focal abnormality.

Adrenals/Urinary Tract: No adrenal masses.

Kidneys normal size, orientation and position. No renal masses,
stones or hydronephrosis.

Ureters are normal in course and in caliber.

Bladder wall is thickened. Mild hazy inflammation is seen in the
perivesicular fat.

Stomach/Bowel: Stomach is within normal limits. Appendix appears
normal. No evidence of bowel wall thickening, distention, or
inflammatory changes.

Vascular/Lymphatic: No significant vascular findings are present. No
enlarged abdominal or pelvic lymph nodes.

Reproductive: Unremarkable.

Other: No abdominal wall hernia or abnormality. No abdominopelvic
ascites.

Musculoskeletal: No acute or significant osseous findings.
IMPRESSION: 1. Cystitis reflected by bladder wall thickening and inflammation in
the adjacent fat. Exam otherwise unremarkable.
2. No renal or ureteral stones. Normal unenhanced CT appearance of
the kidneys.

## 2019-05-05 ENCOUNTER — Ambulatory Visit (HOSPITAL_COMMUNITY)
Admission: EM | Admit: 2019-05-05 | Discharge: 2019-05-05 | Disposition: A | Discharge status: Home / Self Care | Payer: Self-pay | Attending: Family Medicine | Admitting: Family Medicine

## 2019-05-05 ENCOUNTER — Other Ambulatory Visit: Payer: Self-pay

## 2019-05-05 ENCOUNTER — Encounter (HOSPITAL_COMMUNITY): Payer: Self-pay

## 2019-05-05 DIAGNOSIS — L293 Anogenital pruritus, unspecified: Secondary | ICD-10-CM | POA: Insufficient documentation

## 2019-05-05 DIAGNOSIS — Z113 Encounter for screening for infections with a predominantly sexual mode of transmission: Secondary | ICD-10-CM | POA: Insufficient documentation

## 2019-05-05 NOTE — Discharge Instructions (Signed)
May try using Benadryl or cetirizine/Zyrtec to help with itching.  Please continue to monitor for development of any rashes or changes to the skin with the itching.  May try avoiding any new soaps, scented soaps or any new detergents that could be irritating the skin as well.  We are testing you for Gonorrhea, Chlamydia and Trichomonas. We will call you if anything is positive and let you know if you require any further treatment. Please inform partner of any positive results.  Please return if symptoms not improving with treatment, development of fever, nausea, vomiting, abdominal pain, scrotal pain.

## 2019-05-05 NOTE — ED Triage Notes (Signed)
Pt presents with itching to his penis, denies any discharge or burning or pain.

## 2019-05-06 LAB — URINE CYTOLOGY ANCILLARY ONLY
Chlamydia: NEGATIVE
Neisseria Gonorrhea: NEGATIVE
Trichomonas: NEGATIVE

## 2019-05-06 NOTE — ED Provider Notes (Signed)
[facility]    CSN: [id number] Arrival date & time: 05/05/19  1947     History   Chief Complaint Chief Complaint  Patient presents with  . Penile Itching    HPI Jeffrey Heath is a 28 y.o. male no contributing past medical history presenting today for STD screening.  Patient concerned about STDs as over the past 3 to 4 days he has had some itching on his penis.  He denies any rash, changes to the skin or any lesions noted.  He states that the itching only happens at nighttime after he rubs his penis.  He denies symptoms during the day.  Denies dysuria or penile discharge.  Denies any known exposures to STDs.  Denies any swollen lymph nodes.  HPI  History reviewed. No pertinent past medical history.  Patient Active Problem List   Diagnosis Date Noted  . ARF (acute renal failure) (HCC) 04/06/2018  . Acute lower UTI 04/06/2018  . Low back pain 04/06/2018    Past Surgical History:  Procedure Laterality Date  . head surgery         Home Medications    Prior to Admission medications   Medication Sig Start Date End Date Taking? Authorizing Provider  acetaminophen (TYLENOL) 500 MG tablet Take 500 mg by mouth every 6 (six) hours as needed.    [provider]    Family History Family History  Problem Relation Age of Onset  . CAD Neg Hx   . Diabetes Mellitus II Neg Hx     Social History Social History   Tobacco Use  . Smoking status: Current Every Day Smoker    Types: Cigarettes  . Smokeless tobacco: Never Used  Substance Use Topics  . Alcohol use: Yes    Comment: Occasionally.  . Drug use: Not Currently     Allergies   Patient has no known allergies.   Review of Systems Review of Systems  Constitutional: Negative for fever.  HENT: Negative for sore throat.   Respiratory: Negative for shortness of breath.   Cardiovascular: Negative for chest pain.  Gastrointestinal: Negative for abdominal pain, nausea and vomiting.  Genitourinary:  Negative for difficulty urinating, discharge, dysuria, frequency, penile pain, penile swelling, scrotal swelling and testicular pain.  Skin: Negative for rash.  Neurological: Negative for dizziness, light-headedness and headaches.     Physical Exam Triage Vital Signs ED Triage Vitals  Enc Vitals Group     BP 05/05/19 1957 131/81     Pulse Rate 05/05/19 1957 91     Resp 05/05/19 1957 18     Temp 05/05/19 1957 98.1 F (36.7 C)     Temp Source 05/05/19 1957 Oral     SpO2 05/05/19 1957 99 %     Weight --      Height --      Head Circumference --      Peak Flow --      Pain Score 05/05/19 1959 1     Pain Loc --      Pain Edu? --      Excl. in GC? --    No data found.  Updated Vital Signs BP 131/81 (BP Location: Left Arm)   Pulse 91   Temp 98.1 F (36.7 C) (Oral)   Resp 18   SpO2 99%   Visual Acuity Right Eye Distance:   Left Eye Distance:   Bilateral Distance:    Right Eye Near:   Left Eye Near:    Bilateral Near:  Physical Exam Vitals signs and nursing note reviewed.  Constitutional:      Appearance: He is well-developed.     Comments: No acute distress  HENT:     Head: Normocephalic and atraumatic.     Nose: Nose normal.  Eyes:     Conjunctiva/sclera: Conjunctivae normal.  Neck:     Musculoskeletal: Neck supple.  Cardiovascular:     Rate and Rhythm: Normal rate.  Pulmonary:     Effort: Pulmonary effort is normal. No respiratory distress.  Abdominal:     General: There is no distension.     Comments: Soft, nondistended, nontender light D palpation throughout entire abdomen  Genitourinary:    Comments: Normal male exam, no discharge noted in urethral meatus, no surrounding erythema noted to glans, no rash, change in skin, bumps or lesions noted to shaft of penis or in groin region.  No testicular swelling or pain Musculoskeletal: Normal range of motion.  Skin:    General: Skin is warm and dry.  Neurological:     Mental Status: He is alert and  oriented to person, place, and time.      UC Treatments / Results  Labs (all labs ordered are listed, but only abnormal results are displayed) Labs Reviewed  URINE CYTOLOGY ANCILLARY ONLY    EKG None  Radiology No results found.  Procedures Procedures (including critical care time)  Medications Ordered in UC Medications - No data to display  Initial Impression / Assessment and Plan / UC Course  I have reviewed the triage vital signs and the nursing notes.  Pertinent labs & imaging results that were available during my care of the patient were reviewed by me and considered in my medical decision making (see chart for details).     Patient with an itching sensation to his penis after he touches it.  No notable rash on exam, no urinary symptoms.  Will defer empiric treatment for any STDs.  Urine cytology obtained.  Advised possible irritation from any new hygiene products that he may use.  Advised to continue to monitor, may try antihistamines for itching.Discussed strict return precautions. Patient verbalized understanding and is agreeable with plan.  Final Clinical Impressions(s) / UC Diagnoses   Final diagnoses:  Screen for STD (sexually transmitted disease)  Itching of penis     Discharge Instructions     May try using Benadryl or cetirizine/Zyrtec to help with itching.  Please continue to monitor for development of any rashes or changes to the skin with the itching.  May try avoiding any new soaps, scented soaps or any new detergents that could be irritating the skin as well.  We are testing you for Gonorrhea, Chlamydia and Trichomonas. We will call you if anything is positive and let you know if you require any further treatment. Please inform partner of any positive results.  Please return if symptoms not improving with treatment, development of fever, nausea, vomiting, abdominal pain, scrotal pain.   ED Prescriptions    None     Controlled Substance  Prescriptions [place] Controlled Substance Registry consulted? Not Applicable   Lew Dawes, [place] 05/06/19 [phone]

## 2019-09-17 ENCOUNTER — Emergency Department (HOSPITAL_COMMUNITY)
Admission: EM | Admit: 2019-09-17 | Discharge: 2019-09-17 | Disposition: A | Discharge status: Home / Self Care | Payer: Self-pay | Attending: Emergency Medicine | Admitting: Emergency Medicine

## 2019-09-17 ENCOUNTER — Other Ambulatory Visit: Payer: Self-pay

## 2019-09-17 DIAGNOSIS — F17210 Nicotine dependence, cigarettes, uncomplicated: Secondary | ICD-10-CM | POA: Insufficient documentation

## 2019-09-17 DIAGNOSIS — R8281 Pyuria: Secondary | ICD-10-CM | POA: Insufficient documentation

## 2019-09-17 DIAGNOSIS — Z7251 High risk heterosexual behavior: Secondary | ICD-10-CM | POA: Insufficient documentation

## 2019-09-17 DIAGNOSIS — R369 Urethral discharge, unspecified: Secondary | ICD-10-CM

## 2019-09-17 LAB — URINALYSIS, ROUTINE W REFLEX MICROSCOPIC
Bilirubin Urine: NEGATIVE
Glucose, UA: NEGATIVE mg/dL
Ketones, ur: NEGATIVE mg/dL
Nitrite: NEGATIVE
Protein, ur: NEGATIVE mg/dL
Specific Gravity, Urine: 1.025 (ref 1.005–1.030)
pH: 6 (ref 5.0–8.0)

## 2019-09-17 MED ORDER — CEFTRIAXONE SODIUM 250 MG IJ SOLR
250.0000 mg | Freq: Once | INTRAMUSCULAR | Status: AC
  Administered 2019-09-17: 03:00:00 250 mg via INTRAMUSCULAR
  Filled 2019-09-17: qty 250

## 2019-09-17 MED ORDER — AZITHROMYCIN 250 MG PO TABS
1000.0000 mg | ORAL_TABLET | Freq: Once | ORAL | Status: AC
  Administered 2019-09-17: 03:00:00 1000 mg via ORAL
  Filled 2019-09-17: qty 4

## 2019-09-17 MED ORDER — LIDOCAINE HCL (PF) 1 % IJ SOLN
INTRAMUSCULAR | Status: AC
  Administered 2019-09-17: 03:00:00 2.1 mL
  Filled 2019-09-17: qty 5

## 2019-09-17 NOTE — Discharge Instructions (Addendum)
Your urine today was concerning for possible STD exposure. You have been treated for gonorrhea and chlamydia today. Follow-up with the health department in 48 hours for the results of your STD tests. If you test positive for STDs, notify all sexual partners of their need to be tested and treated as well. Do not engage in sexual intercourse for one week. Use a condom when sexually active. 

## 2019-09-17 NOTE — ED Triage Notes (Signed)
Pt reports penile discharge and pain upon urination X4 days.

## 2019-09-17 NOTE — ED Provider Notes (Signed)
[facility] EMERGENCY DEPARTMENT Provider Note   CSN: [id number] Arrival date & time: 09/17/19  0115     History   Chief Complaint Chief Complaint  Patient presents with  . Penile Discharge    HPI Jeffrey Heath is a 28 y.o. male.     28 year old male presenting for penile discharge x2 days.  Describes the discharge as white.  It has been constant, unchanged.  No genital sores, lesions, testicular tenderness, fevers, abdominal pain, nausea or vomiting.  Reports sexual activity with 2 partners in the past 6 months; unprotected sex with 1 partner.   Penile Discharge This is a new problem. The current episode started 2 days ago. The problem occurs constantly. The problem has not changed since onset.Pertinent negatives include no abdominal pain. Nothing aggravates the symptoms. Nothing relieves the symptoms. He has tried nothing for the symptoms. The treatment provided no relief.    No past medical history on file.  Patient Active Problem List   Diagnosis Date Noted  . ARF (acute renal failure) (HCC) 04/06/2018  . Acute lower UTI 04/06/2018  . Low back pain 04/06/2018    Past Surgical History:  Procedure Laterality Date  . head surgery          Home Medications    Prior to Admission medications   Medication Sig Start Date End Date Taking? Authorizing Provider  acetaminophen (TYLENOL) 500 MG tablet Take 500 mg by mouth every 6 (six) hours as needed.    [provider]    Family History Family History  Problem Relation Age of Onset  . CAD Neg Hx   . Diabetes Mellitus II Neg Hx     Social History Social History   Tobacco Use  . Smoking status: Current Every Day Smoker    Types: Cigarettes  . Smokeless tobacco: Never Used  Substance Use Topics  . Alcohol use: Yes    Comment: Occasionally.  . Drug use: Not Currently     Allergies   Patient has no known allergies.   Review of Systems Review of Systems  Gastrointestinal:  Negative for abdominal pain.  Genitourinary: Positive for discharge.  Ten systems reviewed and are negative for acute change, except as noted in the HPI.    Physical Exam Updated Vital Signs BP 139/87 (BP Location: Left Arm)   Pulse 93   Temp 98.1 F (36.7 C) (Oral)   Resp 16   SpO2 100%   Physical Exam Vitals signs and nursing note reviewed. Exam conducted with a chaperone present.  Constitutional:      General: He is not in acute distress.    Appearance: He is well-developed. He is not diaphoretic.     Comments: Nontoxic appearing and in NAD  HENT:     Head: Normocephalic and atraumatic.  Eyes:     General: No scleral icterus.    Conjunctiva/sclera: Conjunctivae normal.  Neck:     Musculoskeletal: Normal range of motion.  Pulmonary:     Effort: Pulmonary effort is normal. No respiratory distress.     Comments: Respirations even and unlabored Genitourinary:    Penis: Circumcised. Discharge (pale yellow) present.      Scrotum/Testes:        Right: Mass, tenderness or swelling not present. Right testis is descended.        Left: Mass, tenderness or swelling not present. Left testis is descended.  Musculoskeletal: Normal range of motion.  Lymphadenopathy:     Lower Body: Right inguinal adenopathy (mild,  nontender) present.  Skin:    General: Skin is warm and dry.     Coloration: Skin is not pale.     Findings: No erythema or rash.  Neurological:     Mental Status: He is alert and oriented to person, place, and time.  Psychiatric:        Behavior: Behavior normal.      ED Treatments / Results  Labs (all labs ordered are listed, but only abnormal results are displayed) Labs Reviewed  URINALYSIS, ROUTINE W REFLEX MICROSCOPIC - Abnormal; Notable for the following components:      Result Value   APPearance HAZY (*)    Hgb urine dipstick LARGE (*)    Leukocytes,Ua LARGE (*)    Bacteria, UA RARE (*)    All other components within normal limits  GC/CHLAMYDIA PROBE  AMP ([place]) NOT AT [facility]    EKG None  Radiology No results found.  Procedures Procedures (including critical care time)  Medications Ordered in ED Medications  cefTRIAXone (ROCEPHIN) injection 250 mg (has no administration in time range)  azithromycin (ZITHROMAX) tablet 1,000 mg (has no administration in time range)     Initial Impression / Assessment and Plan / ED Course  I have reviewed the triage vital signs and the nursing notes.  Pertinent labs & imaging results that were available during my care of the patient were reviewed by me and considered in my medical decision making (see chart for details).        Patient to be discharged with instructions to follow up with the Health Department. Discussed importance of using protection when sexually active. Patient understands that they have GC/Chlamydia cultures pending and that they will need to inform all sexual partners if results return positive. Patient has been treated prophylacticly with azithromycin and rocephin due to pts history and UA with increased WBCs. Return precautions discussed and provided. Patient discharged in stable condition with no unaddressed concerns.   Final Clinical Impressions(s) / ED Diagnoses   Final diagnoses:  Penile discharge  Pyuria  History of unprotected sex    ED Discharge Orders    None       Antonietta Breach, PA-C 09/17/19 [id number]    Ward, Delice Bison, DO 09/17/19 [phone]

## 2023-03-11 ENCOUNTER — Emergency Department (HOSPITAL_COMMUNITY)
Admission: EM | Admit: 2023-03-11 | Discharge: 2023-03-11 | Disposition: A | Discharge status: Home / Self Care | Payer: 59 | Attending: Emergency Medicine | Admitting: Emergency Medicine

## 2023-03-11 ENCOUNTER — Emergency Department (HOSPITAL_COMMUNITY): Payer: 59

## 2023-03-11 ENCOUNTER — Other Ambulatory Visit: Payer: Self-pay

## 2023-03-11 ENCOUNTER — Encounter (HOSPITAL_COMMUNITY): Payer: Self-pay

## 2023-03-11 DIAGNOSIS — R630 Anorexia: Secondary | ICD-10-CM | POA: Insufficient documentation

## 2023-03-11 DIAGNOSIS — D72829 Elevated white blood cell count, unspecified: Secondary | ICD-10-CM | POA: Insufficient documentation

## 2023-03-11 DIAGNOSIS — J029 Acute pharyngitis, unspecified: Secondary | ICD-10-CM

## 2023-03-11 LAB — COMPREHENSIVE METABOLIC PANEL
ALT: 20 U/L (ref 0–44)
AST: 26 U/L (ref 15–41)
Albumin: 4.1 g/dL (ref 3.5–5.0)
Alkaline Phosphatase: 52 U/L (ref 38–126)
Anion gap: 11 (ref 5–15)
BUN: 7 mg/dL (ref 6–20)
CO2: 26 mmol/L (ref 22–32)
Calcium: 9.3 mg/dL (ref 8.9–10.3)
Chloride: 102 mmol/L (ref 98–111)
Creatinine, Ser: 0.9 mg/dL (ref 0.61–1.24)
GFR, Estimated: >60 mL/min (ref >60)
Glucose, Bld: 105 mg/dL — ABNORMAL HIGH (ref 70–99)
Potassium: 3.6 mmol/L (ref 3.5–5.1)
Sodium: 139 mmol/L (ref 135–145)
Total Bilirubin: 0.6 mg/dL (ref 0.3–1.2)
Total Protein: 7.1 g/dL (ref 6.5–8.1)

## 2023-03-11 LAB — CBC WITH DIFFERENTIAL/PLATELET
Abs Immature Granulocytes: 0.03 10*3/uL (ref 0.00–0.07)
Basophils Absolute: 0 10*3/uL (ref 0.0–0.1)
Basophils Relative: 0 %
Eosinophils Absolute: 0 10*3/uL (ref 0.0–0.5)
Eosinophils Relative: 0 %
HCT: 39.4 % (ref 39.0–52.0)
Hemoglobin: 13.6 g/dL (ref 13.0–17.0)
Immature Granulocytes: 0 %
Lymphocytes Relative: 11 %
Lymphs Abs: 1.2 10*3/uL (ref 0.7–4.0)
MCH: 31.9 pg (ref 26.0–34.0)
MCHC: 34.5 g/dL (ref 30.0–36.0)
MCV: 92.5 fL (ref 80.0–100.0)
Monocytes Absolute: 0.8 10*3/uL (ref 0.1–1.0)
Monocytes Relative: 7 %
Neutro Abs: 9.5 10*3/uL — ABNORMAL HIGH (ref 1.7–7.7)
Neutrophils Relative %: 82 %
Platelets: 188 10*3/uL (ref 150–400)
RBC: 4.26 MIL/uL (ref 4.22–5.81)
RDW: 10.9 % — ABNORMAL LOW (ref 11.5–15.5)
WBC: 11.6 10*3/uL — ABNORMAL HIGH (ref 4.0–10.5)
nRBC: 0 % (ref 0.0–0.2)

## 2023-03-11 LAB — MAGNESIUM: Magnesium: 2 mg/dL (ref 1.7–2.4)

## 2023-03-11 MED ORDER — AMOXICILLIN-POT CLAVULANATE 875-125 MG PO TABS: 1.0000 | ORAL_TABLET | Freq: Two times a day (BID) | ORAL | 0 refills | Status: AC

## 2023-03-11 MED ORDER — AMOXICILLIN-POT CLAVULANATE 875-125 MG PO TABS: 1.0000 | ORAL_TABLET | Freq: Two times a day (BID) | ORAL | 0 refills | Status: DC

## 2023-03-11 MED ORDER — IOHEXOL 350 MG/ML SOLN
75.0000 mL | Freq: Once | INTRAVENOUS | Status: AC | PRN
  Administered 2023-03-11: 75 mL via INTRAVENOUS

## 2023-03-11 MED ORDER — KETOROLAC TROMETHAMINE 15 MG/ML IJ SOLN
15.0000 mg | Freq: Once | INTRAMUSCULAR | Status: AC
  Administered 2023-03-11: 15 mg via INTRAVENOUS
  Filled 2023-03-11: qty 1

## 2023-03-11 MED ORDER — DEXAMETHASONE SODIUM PHOSPHATE 10 MG/ML IJ SOLN
10.0000 mg | Freq: Once | INTRAMUSCULAR | Status: AC
  Administered 2023-03-11: 10 mg via INTRAVENOUS
  Filled 2023-03-11: qty 1

## 2023-03-11 MED ORDER — AMOXICILLIN-POT CLAVULANATE 875-125 MG PO TABS
1.0000 | ORAL_TABLET | Freq: Once | ORAL | Status: AC
  Administered 2023-03-11: 1 via ORAL
  Filled 2023-03-11: qty 1

## 2023-03-11 MED ORDER — LACTATED RINGERS IV BOLUS
1000.0000 mL | Freq: Once | INTRAVENOUS | Status: AC
  Administered 2023-03-11: 1000 mL via INTRAVENOUS

## 2023-03-11 NOTE — Discharge Instructions (Addendum)
A prescription for antibiotics was sent to your pharmacy.  Take as prescribed, starting tonight.  Take Tylenol and ibuprofen as needed for pain and soreness.  Continue to stay hydrated.  Return to the emergency department for any worsening of symptoms.

## 2023-03-11 NOTE — ED Provider Notes (Signed)
[facility] Provider Note   CSN: [id number] Arrival date & time: 03/11/23  [id number]     History  Chief Complaint  Patient presents with   Sore Throat    Jeffrey Heath is a 32 y.o. male.  HPI Patient presents for sore throat.  Pain has been right-sided.  It is worse with swallowing.  It has been present for the past 3 days.  Associated symptoms include runny nose.  He denies any fevers or chills.  Despite odynophagia, patient has continued to be able to eat and drink.  He has a decreased appetite.    Home Medications Prior to Admission medications   Medication Sig Start Date End Date Taking? Authorizing Provider  acetaminophen (TYLENOL) 500 MG tablet Take 500 mg by mouth every 6 (six) hours as needed.    [provider]  amoxicillin-clavulanate (AUGMENTIN) 875-125 MG tablet Take 1 tablet by mouth every 12 (twelve) hours for 10 days. 03/11/23 03/21/23  Godfrey Pick, MD      Allergies    Patient has no known allergies.    Review of Systems   Review of Systems  HENT:  Positive for rhinorrhea and sore throat.   All other systems reviewed and are negative.   Physical Exam Updated Vital Signs BP 119/77   Pulse 71   Temp 98 F (36.7 C)   Resp 18   Ht 5\' 8"  (1.727 m)   Wt 79.4 kg   SpO2 98%   BMI 26.61 kg/m  Physical Exam Vitals and nursing note reviewed.  Constitutional:      General: He is not in acute distress.    Appearance: He is well-developed. He is not ill-appearing, toxic-appearing or diaphoretic.  HENT:     Head: Normocephalic and atraumatic.     Nose: No congestion.     Mouth/Throat:     Mouth: Mucous membranes are moist.     Pharynx: Uvula midline. No pharyngeal swelling or uvula swelling.     Tonsils: Tonsillar exudate present. 3+ on the right. 2+ on the left.  Eyes:     Conjunctiva/sclera: Conjunctivae normal.  Cardiovascular:     Rate and Rhythm: Normal rate and regular rhythm.     Heart sounds: No  murmur heard. Pulmonary:     Effort: Pulmonary effort is normal. No respiratory distress.  Abdominal:     General: There is no distension.     Palpations: Abdomen is soft.     Tenderness: There is no abdominal tenderness.  Musculoskeletal:        General: No swelling.     Cervical back: Neck supple.  Lymphadenopathy:     Cervical: Cervical adenopathy present.  Skin:    General: Skin is warm and dry.     Capillary Refill: Capillary refill takes less than 2 seconds.  Neurological:     General: No focal deficit present.     Mental Status: He is alert and oriented to person, place, and time.  Psychiatric:        Mood and Affect: Mood normal.        Behavior: Behavior normal.     ED Results / Procedures / Treatments   Labs (all labs ordered are listed, but only abnormal results are displayed) Labs Reviewed  CBC WITH DIFFERENTIAL/PLATELET - Abnormal; Notable for the following components:      Result Value   WBC 11.6 (*)    RDW 10.9 (*)    Neutro Abs 9.5 (*)  All other components within normal limits  COMPREHENSIVE METABOLIC PANEL - Abnormal; Notable for the following components:   Glucose, Bld 105 (*)    All other components within normal limits  MAGNESIUM    EKG None  Radiology CT Soft Tissue Neck W Contrast  Result Date: 03/11/2023 CLINICAL DATA:  Epiglottitis or tonsillitis suspected.  Sore throat. EXAM: CT NECK WITH CONTRAST TECHNIQUE: Multidetector CT imaging of the neck was performed using the standard protocol following the bolus administration of intravenous contrast. RADIATION DOSE REDUCTION: This exam was performed according to the departmental dose-optimization program which includes automated exposure control, adjustment of the mA and/or kV according to patient size and/or use of iterative reconstruction technique. CONTRAST:  40mL OMNIPAQUE IOHEXOL 350 MG/ML SOLN COMPARISON:  None Available. FINDINGS: Pharynx and larynx: Hyperintense in the bilateral palatine  tonsils, right-greater-than-left, with a striated appearance. Bilateral parapharyngeal spaces are normal in appearance. There is no discrete intra or peritonsillar fluid collection epiglottis is normal in appearance. Salivary glands: No inflammation, mass, or stone. Thyroid: Normal. Lymph nodes: Prominent bilateral level 2A lymph nodes are symmetric and favored to be reactive. Vascular: Negative. Limited intracranial: Negative. Visualized orbits: Negative. Mastoids and visualized paranasal sinuses: Negative. Skeleton: No acute or aggressive process. Upper chest: Negative. Other: None. IMPRESSION: 1. Findings consistent with bilateral palatine tonsillitis, right-greater-than-left. No discrete intra or peritonsillar fluid collection. 2. Prominent bilateral level 2A lymph nodes, favored to be reactive. Electronically Signed   By: Marin Roberts M.D.   On: 03/11/2023 13:38    Procedures Procedures    Medications Ordered in ED Medications  lactated ringers bolus 1,000 mL (0 mLs Intravenous Stopped 03/11/23 1112)  ketorolac (TORADOL) 15 MG/ML injection 15 mg (15 mg Intravenous Given 03/11/23 1000)  amoxicillin-clavulanate (AUGMENTIN) 875-125 MG per tablet 1 tablet (1 tablet Oral Given 03/11/23 0958)  dexamethasone (DECADRON) injection 10 mg (10 mg Intravenous Given 03/11/23 0959)  iohexol (OMNIPAQUE) 350 MG/ML injection 75 mL (75 mLs Intravenous Contrast Given 03/11/23 1325)    ED Course/ Medical Decision Making/ A&P                             Medical Decision Making Amount and/or Complexity of Data Reviewed Labs: ordered. Radiology: ordered.  Risk Prescription drug management.  This patient presents to the ED for concern of sore throat, this involves an extensive number of treatment options, and is a complaint that carries with it a high risk of complications and morbidity.  The differential diagnosis includes URI, pharyngitis, PTA, RPA   Co morbidities that complicate the patient  evaluation  N/A   Additional history obtained:  Additional history obtained from N/A External records from outside source obtained and reviewed including EMR   Lab Tests:  I Ordered, and personally interpreted labs.  The pertinent results include: Mild leukocytosis with otherwise unremarkable results   Imaging Studies ordered:  I ordered imaging studies including CT neck I independently visualized and interpreted imaging which showed no drainable fluid collection I agree with the radiologist interpretation   Cardiac Monitoring: / EKG:  The patient was maintained on a cardiac monitor.  I personally viewed and interpreted the cardiac monitored which showed an underlying rhythm of: Sinus rhythm   Problem List / ED Course / Critical interventions / Medication management  Patient is a healthy 32 year old male presenting for 3 days of right-sided throat soreness, worsened with swallowing.  Vital signs on arrival are reassuring.  Patient is well-appearing on  exam.  Exam is notable for right-sided tonsillar swelling with exudate present.  Oropharynx is widely patent.  Uvula is midline.  Treatment for pharyngitis was initiated in the ED.  Patient to undergo CT imaging to assess for PTA.  Lab work is notable for mild leukocytosis.  CT imaging did not show any drainable fluid collections.  Patient had improved symptoms while in the ED.  He was prescribed ongoing antibiotics and advised to take Tylenol and ibuprofen as needed for pain.  He was discharged in good condition. I ordered medication including Augmentin, Decadron for pharyngitis; Toradol for analgesia; IV fluids for hydration Reevaluation of the patient after these medicines showed that the patient improved I have reviewed the patients home medicines and have made adjustments as needed   Social Determinants of Health:  Does not have a PCP         Final Clinical Impression(s) / ED Diagnoses Final diagnoses:  Pharyngitis,  unspecified etiology    Rx / DC Orders ED Discharge Orders          Ordered    amoxicillin-clavulanate (AUGMENTIN) 875-125 MG tablet  Every 12 hours,   Status:  Discontinued        03/11/23 1507    amoxicillin-clavulanate (AUGMENTIN) 875-125 MG tablet  Every 12 hours        03/11/23 1508              Godfrey Pick, MD 03/11/23 [phone]

## 2023-03-11 NOTE — ED Triage Notes (Signed)
Pt came to ED for as sore throat on his right side. Pt states he has hasd a runny nose and weakness for a couple of days. Axox4.
